# Patient Record
Sex: Male | Born: 1955 | Race: White | Hispanic: No | Marital: Single
Health system: Southern US, Community
[De-identification: ages and names within clinical notes are randomized; demographics above are authoritative.]

---

## 2021-05-23 ENCOUNTER — Other Ambulatory Visit (HOSPITAL_COMMUNITY): Payer: Medicare Other

## 2021-05-23 ENCOUNTER — Inpatient Hospital Stay
Admission: RE | Admit: 2021-05-23 | Discharge: 2021-07-07 | Disposition: A | Discharge status: Skilled Nursing Facility | Payer: Medicare Other | Source: Ambulatory Visit | Attending: Internal Medicine | Admitting: Internal Medicine

## 2021-05-23 DIAGNOSIS — J189 Pneumonia, unspecified organism: Secondary | ICD-10-CM

## 2021-05-23 DIAGNOSIS — K651 Peritoneal abscess: Secondary | ICD-10-CM

## 2021-05-23 DIAGNOSIS — R0902 Hypoxemia: Secondary | ICD-10-CM

## 2021-05-23 DIAGNOSIS — J449 Chronic obstructive pulmonary disease, unspecified: Secondary | ICD-10-CM

## 2021-05-23 DIAGNOSIS — T85598A Other mechanical complication of other gastrointestinal prosthetic devices, implants and grafts, initial encounter: Secondary | ICD-10-CM

## 2021-05-23 DIAGNOSIS — J9690 Respiratory failure, unspecified, unspecified whether with hypoxia or hypercapnia: Secondary | ICD-10-CM

## 2021-05-24 LAB — CBC
HCT: 33.9 % — ABNORMAL LOW (ref 39.0–52.0)
Hemoglobin: 10.2 g/dL — ABNORMAL LOW (ref 13.0–17.0)
MCH: 25.1 pg — ABNORMAL LOW (ref 26.0–34.0)
MCHC: 30.1 g/dL (ref 30.0–36.0)
MCV: 83.5 fL (ref 80.0–100.0)
Platelets: 364 10*3/uL (ref 150–400)
RBC: 4.06 MIL/uL — ABNORMAL LOW (ref 4.22–5.81)
RDW: 21.2 % — ABNORMAL HIGH (ref 11.5–15.5)
WBC: 15.8 10*3/uL — ABNORMAL HIGH (ref 4.0–10.5)
nRBC: 0 % (ref 0.0–0.2)

## 2021-05-24 LAB — BASIC METABOLIC PANEL
Anion gap: 8 (ref 5–15)
BUN: 25 mg/dL — ABNORMAL HIGH (ref 8–23)
CO2: 28 mmol/L (ref 22–32)
Calcium: 8.6 mg/dL — ABNORMAL LOW (ref 8.9–10.3)
Chloride: 98 mmol/L (ref 98–111)
Creatinine, Ser: 0.75 mg/dL (ref 0.61–1.24)
GFR, Estimated: >60 mL/min (ref >60)
Glucose, Bld: 92 mg/dL (ref 70–99)
Potassium: 3.3 mmol/L — ABNORMAL LOW (ref 3.5–5.1)
Sodium: 134 mmol/L — ABNORMAL LOW (ref 135–145)

## 2021-05-24 LAB — MAGNESIUM: Magnesium: 2 mg/dL (ref 1.7–2.4)

## 2021-05-25 LAB — TRIGLYCERIDES: Triglycerides: 59 mg/dL (ref <150)

## 2021-05-25 LAB — POTASSIUM: Potassium: 3.3 mmol/L — ABNORMAL LOW (ref 3.5–5.1)

## 2021-05-25 LAB — PHOSPHORUS: Phosphorus: 3.5 mg/dL (ref 2.5–4.6)

## 2021-05-25 LAB — MAGNESIUM: Magnesium: 2 mg/dL (ref 1.7–2.4)

## 2021-05-26 LAB — POTASSIUM: Potassium: 3.1 mmol/L — ABNORMAL LOW (ref 3.5–5.1)

## 2021-05-28 LAB — COMPREHENSIVE METABOLIC PANEL
ALT: 23 U/L (ref 0–44)
AST: 19 U/L (ref 15–41)
Albumin: 2.2 g/dL — ABNORMAL LOW (ref 3.5–5.0)
Alkaline Phosphatase: 126 U/L (ref 38–126)
Anion gap: 6 (ref 5–15)
BUN: 30 mg/dL — ABNORMAL HIGH (ref 8–23)
CO2: 27 mmol/L (ref 22–32)
Calcium: 8.6 mg/dL — ABNORMAL LOW (ref 8.9–10.3)
Chloride: 105 mmol/L (ref 98–111)
Creatinine, Ser: 0.67 mg/dL (ref 0.61–1.24)
GFR, Estimated: >60 mL/min (ref >60)
Glucose, Bld: 108 mg/dL — ABNORMAL HIGH (ref 70–99)
Potassium: 3.3 mmol/L — ABNORMAL LOW (ref 3.5–5.1)
Sodium: 138 mmol/L (ref 135–145)
Total Bilirubin: 0.4 mg/dL (ref 0.3–1.2)
Total Protein: 6.7 g/dL (ref 6.5–8.1)

## 2021-05-28 LAB — PHOSPHORUS: Phosphorus: 2.9 mg/dL (ref 2.5–4.6)

## 2021-05-28 LAB — TRIGLYCERIDES: Triglycerides: 24 mg/dL (ref <150)

## 2021-05-28 LAB — MAGNESIUM: Magnesium: 1.9 mg/dL (ref 1.7–2.4)

## 2021-05-30 LAB — POTASSIUM: Potassium: 4 mmol/L (ref 3.5–5.1)

## 2021-05-31 ENCOUNTER — Other Ambulatory Visit (HOSPITAL_COMMUNITY): Payer: Medicare Other

## 2021-05-31 LAB — COMPREHENSIVE METABOLIC PANEL
ALT: 20 U/L (ref 0–44)
AST: 15 U/L (ref 15–41)
Albumin: 2.3 g/dL — ABNORMAL LOW (ref 3.5–5.0)
Alkaline Phosphatase: 108 U/L (ref 38–126)
Anion gap: 5 (ref 5–15)
BUN: 34 mg/dL — ABNORMAL HIGH (ref 8–23)
CO2: 24 mmol/L (ref 22–32)
Calcium: 8.2 mg/dL — ABNORMAL LOW (ref 8.9–10.3)
Chloride: 106 mmol/L (ref 98–111)
Creatinine, Ser: 0.58 mg/dL — ABNORMAL LOW (ref 0.61–1.24)
GFR, Estimated: >60 mL/min (ref >60)
Glucose, Bld: 261 mg/dL — ABNORMAL HIGH (ref 70–99)
Potassium: 3.9 mmol/L (ref 3.5–5.1)
Sodium: 135 mmol/L (ref 135–145)
Total Bilirubin: 0.2 mg/dL — ABNORMAL LOW (ref 0.3–1.2)
Total Protein: 6.6 g/dL (ref 6.5–8.1)

## 2021-05-31 LAB — TRIGLYCERIDES: Triglycerides: 48 mg/dL (ref <150)

## 2021-05-31 LAB — PHOSPHORUS: Phosphorus: 3 mg/dL (ref 2.5–4.6)

## 2021-05-31 LAB — MAGNESIUM: Magnesium: 1.9 mg/dL (ref 1.7–2.4)

## 2021-05-31 LAB — POTASSIUM: Potassium: 3.6 mmol/L (ref 3.5–5.1)

## 2021-06-03 LAB — COMPREHENSIVE METABOLIC PANEL
ALT: 26 U/L (ref 0–44)
AST: 19 U/L (ref 15–41)
Albumin: 2.5 g/dL — ABNORMAL LOW (ref 3.5–5.0)
Alkaline Phosphatase: 102 U/L (ref 38–126)
Anion gap: 8 (ref 5–15)
BUN: 31 mg/dL — ABNORMAL HIGH (ref 8–23)
CO2: 30 mmol/L (ref 22–32)
Calcium: 8.2 mg/dL — ABNORMAL LOW (ref 8.9–10.3)
Chloride: 93 mmol/L — ABNORMAL LOW (ref 98–111)
Creatinine, Ser: 0.68 mg/dL (ref 0.61–1.24)
GFR, Estimated: >60 mL/min (ref >60)
Glucose, Bld: 216 mg/dL — ABNORMAL HIGH (ref 70–99)
Potassium: 3.2 mmol/L — ABNORMAL LOW (ref 3.5–5.1)
Sodium: 131 mmol/L — ABNORMAL LOW (ref 135–145)
Total Bilirubin: 0.4 mg/dL (ref 0.3–1.2)
Total Protein: 6.7 g/dL (ref 6.5–8.1)

## 2021-06-03 LAB — MAGNESIUM: Magnesium: 1.9 mg/dL (ref 1.7–2.4)

## 2021-06-03 LAB — TRIGLYCERIDES: Triglycerides: 69 mg/dL (ref <150)

## 2021-06-03 LAB — PHOSPHORUS: Phosphorus: 2.7 mg/dL (ref 2.5–4.6)

## 2021-06-04 LAB — CBC
HCT: 36.9 % — ABNORMAL LOW (ref 39.0–52.0)
Hemoglobin: 11.2 g/dL — ABNORMAL LOW (ref 13.0–17.0)
MCH: 25.8 pg — ABNORMAL LOW (ref 26.0–34.0)
MCHC: 30.4 g/dL (ref 30.0–36.0)
MCV: 85 fL (ref 80.0–100.0)
Platelets: 277 10*3/uL (ref 150–400)
RBC: 4.34 MIL/uL (ref 4.22–5.81)
RDW: 20.5 % — ABNORMAL HIGH (ref 11.5–15.5)
WBC: 18.5 10*3/uL — ABNORMAL HIGH (ref 4.0–10.5)
nRBC: 0 % (ref 0.0–0.2)

## 2021-06-04 LAB — BASIC METABOLIC PANEL
Anion gap: 5 (ref 5–15)
BUN: 29 mg/dL — ABNORMAL HIGH (ref 8–23)
CO2: 32 mmol/L (ref 22–32)
Calcium: 8.1 mg/dL — ABNORMAL LOW (ref 8.9–10.3)
Chloride: 97 mmol/L — ABNORMAL LOW (ref 98–111)
Creatinine, Ser: 0.68 mg/dL (ref 0.61–1.24)
GFR, Estimated: >60 mL/min (ref >60)
Glucose, Bld: 156 mg/dL — ABNORMAL HIGH (ref 70–99)
Potassium: 3.2 mmol/L — ABNORMAL LOW (ref 3.5–5.1)
Sodium: 134 mmol/L — ABNORMAL LOW (ref 135–145)

## 2021-06-05 LAB — CBC
HCT: 38.2 % — ABNORMAL LOW (ref 39.0–52.0)
Hemoglobin: 11.5 g/dL — ABNORMAL LOW (ref 13.0–17.0)
MCH: 25.4 pg — ABNORMAL LOW (ref 26.0–34.0)
MCHC: 30.1 g/dL (ref 30.0–36.0)
MCV: 84.3 fL (ref 80.0–100.0)
Platelets: 266 10*3/uL (ref 150–400)
RBC: 4.53 MIL/uL (ref 4.22–5.81)
RDW: 20.5 % — ABNORMAL HIGH (ref 11.5–15.5)
WBC: 16.8 10*3/uL — ABNORMAL HIGH (ref 4.0–10.5)
nRBC: 0 % (ref 0.0–0.2)

## 2021-06-05 LAB — BASIC METABOLIC PANEL
Anion gap: 6 (ref 5–15)
BUN: 25 mg/dL — ABNORMAL HIGH (ref 8–23)
CO2: 31 mmol/L (ref 22–32)
Calcium: 8.2 mg/dL — ABNORMAL LOW (ref 8.9–10.3)
Chloride: 95 mmol/L — ABNORMAL LOW (ref 98–111)
Creatinine, Ser: 0.64 mg/dL (ref 0.61–1.24)
GFR, Estimated: >60 mL/min (ref >60)
Glucose, Bld: 189 mg/dL — ABNORMAL HIGH (ref 70–99)
Potassium: 3.3 mmol/L — ABNORMAL LOW (ref 3.5–5.1)
Sodium: 132 mmol/L — ABNORMAL LOW (ref 135–145)

## 2021-06-06 LAB — COMPREHENSIVE METABOLIC PANEL
ALT: 29 U/L (ref 0–44)
AST: 17 U/L (ref 15–41)
Albumin: 2.6 g/dL — ABNORMAL LOW (ref 3.5–5.0)
Alkaline Phosphatase: 94 U/L (ref 38–126)
Anion gap: 6 (ref 5–15)
BUN: 29 mg/dL — ABNORMAL HIGH (ref 8–23)
CO2: 30 mmol/L (ref 22–32)
Calcium: 8.4 mg/dL — ABNORMAL LOW (ref 8.9–10.3)
Chloride: 100 mmol/L (ref 98–111)
Creatinine, Ser: 0.64 mg/dL (ref 0.61–1.24)
GFR, Estimated: >60 mL/min (ref >60)
Glucose, Bld: 147 mg/dL — ABNORMAL HIGH (ref 70–99)
Potassium: 3.8 mmol/L (ref 3.5–5.1)
Sodium: 136 mmol/L (ref 135–145)
Total Bilirubin: 0.3 mg/dL (ref 0.3–1.2)
Total Protein: 6.4 g/dL — ABNORMAL LOW (ref 6.5–8.1)

## 2021-06-06 LAB — TRIGLYCERIDES: Triglycerides: 45 mg/dL (ref <150)

## 2021-06-06 LAB — PHOSPHORUS: Phosphorus: 2.5 mg/dL (ref 2.5–4.6)

## 2021-06-06 LAB — MAGNESIUM: Magnesium: 1.9 mg/dL (ref 1.7–2.4)

## 2021-06-06 LAB — HEMOGLOBIN A1C
Hgb A1c MFr Bld: 5.7 % — ABNORMAL HIGH (ref 4.8–5.6)
Mean Plasma Glucose: 117 mg/dL

## 2021-06-08 ENCOUNTER — Other Ambulatory Visit (HOSPITAL_COMMUNITY): Payer: Medicare Other

## 2021-06-08 LAB — CBC
HCT: 36.1 % — ABNORMAL LOW (ref 39.0–52.0)
Hemoglobin: 11.1 g/dL — ABNORMAL LOW (ref 13.0–17.0)
MCH: 26 pg (ref 26.0–34.0)
MCHC: 30.7 g/dL (ref 30.0–36.0)
MCV: 84.5 fL (ref 80.0–100.0)
Platelets: 205 10*3/uL (ref 150–400)
RBC: 4.27 MIL/uL (ref 4.22–5.81)
RDW: 21 % — ABNORMAL HIGH (ref 11.5–15.5)
WBC: 17.9 10*3/uL — ABNORMAL HIGH (ref 4.0–10.5)
nRBC: 0 % (ref 0.0–0.2)

## 2021-06-08 LAB — TSH: TSH: 0.285 u[IU]/mL — ABNORMAL LOW (ref 0.350–4.500)

## 2021-06-08 LAB — URINALYSIS, ROUTINE W REFLEX MICROSCOPIC
Bilirubin Urine: NEGATIVE
Glucose, UA: NEGATIVE mg/dL
Hgb urine dipstick: NEGATIVE
Ketones, ur: NEGATIVE mg/dL
Leukocytes,Ua: NEGATIVE
Nitrite: NEGATIVE
Protein, ur: NEGATIVE mg/dL
Specific Gravity, Urine: 1.013 (ref 1.005–1.030)
pH: 6 (ref 5.0–8.0)

## 2021-06-08 LAB — BASIC METABOLIC PANEL
Anion gap: 7 (ref 5–15)
BUN: 27 mg/dL — ABNORMAL HIGH (ref 8–23)
CO2: 33 mmol/L — ABNORMAL HIGH (ref 22–32)
Calcium: 8.1 mg/dL — ABNORMAL LOW (ref 8.9–10.3)
Chloride: 91 mmol/L — ABNORMAL LOW (ref 98–111)
Creatinine, Ser: 0.64 mg/dL (ref 0.61–1.24)
GFR, Estimated: >60 mL/min (ref >60)
Glucose, Bld: 225 mg/dL — ABNORMAL HIGH (ref 70–99)
Potassium: 4.3 mmol/L (ref 3.5–5.1)
Sodium: 131 mmol/L — ABNORMAL LOW (ref 135–145)

## 2021-06-09 LAB — COMPREHENSIVE METABOLIC PANEL
ALT: 23 U/L (ref 0–44)
AST: 14 U/L — ABNORMAL LOW (ref 15–41)
Albumin: 2.4 g/dL — ABNORMAL LOW (ref 3.5–5.0)
Alkaline Phosphatase: 85 U/L (ref 38–126)
Anion gap: 5 (ref 5–15)
BUN: 25 mg/dL — ABNORMAL HIGH (ref 8–23)
CO2: 34 mmol/L — ABNORMAL HIGH (ref 22–32)
Calcium: 7.9 mg/dL — ABNORMAL LOW (ref 8.9–10.3)
Chloride: 93 mmol/L — ABNORMAL LOW (ref 98–111)
Creatinine, Ser: 0.61 mg/dL (ref 0.61–1.24)
GFR, Estimated: >60 mL/min (ref >60)
Glucose, Bld: 215 mg/dL — ABNORMAL HIGH (ref 70–99)
Potassium: 4.1 mmol/L (ref 3.5–5.1)
Sodium: 132 mmol/L — ABNORMAL LOW (ref 135–145)
Total Bilirubin: 0.5 mg/dL (ref 0.3–1.2)
Total Protein: 5.5 g/dL — ABNORMAL LOW (ref 6.5–8.1)

## 2021-06-09 LAB — TRIGLYCERIDES: Triglycerides: 55 mg/dL (ref <150)

## 2021-06-09 LAB — PHOSPHORUS: Phosphorus: 3.7 mg/dL (ref 2.5–4.6)

## 2021-06-09 LAB — URINE CULTURE: Culture: <10000 — AB

## 2021-06-09 LAB — MAGNESIUM: Magnesium: 2.1 mg/dL (ref 1.7–2.4)

## 2021-06-12 LAB — COMPREHENSIVE METABOLIC PANEL
ALT: 20 U/L (ref 0–44)
AST: 12 U/L — ABNORMAL LOW (ref 15–41)
Albumin: 2.4 g/dL — ABNORMAL LOW (ref 3.5–5.0)
Alkaline Phosphatase: 74 U/L (ref 38–126)
Anion gap: 3 — ABNORMAL LOW (ref 5–15)
BUN: 25 mg/dL — ABNORMAL HIGH (ref 8–23)
CO2: 34 mmol/L — ABNORMAL HIGH (ref 22–32)
Calcium: 7.9 mg/dL — ABNORMAL LOW (ref 8.9–10.3)
Chloride: 93 mmol/L — ABNORMAL LOW (ref 98–111)
Creatinine, Ser: 0.58 mg/dL — ABNORMAL LOW (ref 0.61–1.24)
GFR, Estimated: >60 mL/min (ref >60)
Glucose, Bld: 142 mg/dL — ABNORMAL HIGH (ref 70–99)
Potassium: 4.1 mmol/L (ref 3.5–5.1)
Sodium: 130 mmol/L — ABNORMAL LOW (ref 135–145)
Total Bilirubin: 0.4 mg/dL (ref 0.3–1.2)
Total Protein: 5.3 g/dL — ABNORMAL LOW (ref 6.5–8.1)

## 2021-06-12 LAB — MAGNESIUM: Magnesium: 2.2 mg/dL (ref 1.7–2.4)

## 2021-06-12 LAB — TRIGLYCERIDES: Triglycerides: 59 mg/dL (ref <150)

## 2021-06-12 LAB — PHOSPHORUS: Phosphorus: 3.6 mg/dL (ref 2.5–4.6)

## 2021-06-13 LAB — BASIC METABOLIC PANEL
Anion gap: 3 — ABNORMAL LOW (ref 5–15)
BUN: 24 mg/dL — ABNORMAL HIGH (ref 8–23)
CO2: 36 mmol/L — ABNORMAL HIGH (ref 22–32)
Calcium: 8 mg/dL — ABNORMAL LOW (ref 8.9–10.3)
Chloride: 97 mmol/L — ABNORMAL LOW (ref 98–111)
Creatinine, Ser: 0.69 mg/dL (ref 0.61–1.24)
GFR, Estimated: >60 mL/min (ref >60)
Glucose, Bld: 185 mg/dL — ABNORMAL HIGH (ref 70–99)
Potassium: 4.1 mmol/L (ref 3.5–5.1)
Sodium: 136 mmol/L (ref 135–145)

## 2021-06-14 LAB — BASIC METABOLIC PANEL
Anion gap: 7 (ref 5–15)
BUN: 25 mg/dL — ABNORMAL HIGH (ref 8–23)
CO2: 28 mmol/L (ref 22–32)
Calcium: 8 mg/dL — ABNORMAL LOW (ref 8.9–10.3)
Chloride: 99 mmol/L (ref 98–111)
Creatinine, Ser: 0.59 mg/dL — ABNORMAL LOW (ref 0.61–1.24)
GFR, Estimated: >60 mL/min (ref >60)
Glucose, Bld: 120 mg/dL — ABNORMAL HIGH (ref 70–99)
Potassium: 3.2 mmol/L — ABNORMAL LOW (ref 3.5–5.1)
Sodium: 134 mmol/L — ABNORMAL LOW (ref 135–145)

## 2021-06-15 LAB — COMPREHENSIVE METABOLIC PANEL
ALT: 21 U/L (ref 0–44)
AST: 15 U/L (ref 15–41)
Albumin: 2.5 g/dL — ABNORMAL LOW (ref 3.5–5.0)
Alkaline Phosphatase: 73 U/L (ref 38–126)
Anion gap: 6 (ref 5–15)
BUN: 22 mg/dL (ref 8–23)
CO2: 30 mmol/L (ref 22–32)
Calcium: 8 mg/dL — ABNORMAL LOW (ref 8.9–10.3)
Chloride: 98 mmol/L (ref 98–111)
Creatinine, Ser: 0.59 mg/dL — ABNORMAL LOW (ref 0.61–1.24)
GFR, Estimated: >60 mL/min (ref >60)
Glucose, Bld: 146 mg/dL — ABNORMAL HIGH (ref 70–99)
Potassium: 3.4 mmol/L — ABNORMAL LOW (ref 3.5–5.1)
Sodium: 134 mmol/L — ABNORMAL LOW (ref 135–145)
Total Bilirubin: 0.6 mg/dL (ref 0.3–1.2)
Total Protein: 5.2 g/dL — ABNORMAL LOW (ref 6.5–8.1)

## 2021-06-15 LAB — TRIGLYCERIDES: Triglycerides: 84 mg/dL (ref <150)

## 2021-06-15 LAB — MAGNESIUM: Magnesium: 1.8 mg/dL (ref 1.7–2.4)

## 2021-06-15 LAB — PHOSPHORUS: Phosphorus: 2.5 mg/dL (ref 2.5–4.6)

## 2021-06-16 LAB — POTASSIUM: Potassium: 3.6 mmol/L (ref 3.5–5.1)

## 2021-06-17 LAB — BASIC METABOLIC PANEL
Anion gap: 5 (ref 5–15)
BUN: 22 mg/dL (ref 8–23)
CO2: 35 mmol/L — ABNORMAL HIGH (ref 22–32)
Calcium: 8 mg/dL — ABNORMAL LOW (ref 8.9–10.3)
Chloride: 94 mmol/L — ABNORMAL LOW (ref 98–111)
Creatinine, Ser: 0.64 mg/dL (ref 0.61–1.24)
GFR, Estimated: >60 mL/min (ref >60)
Glucose, Bld: 124 mg/dL — ABNORMAL HIGH (ref 70–99)
Potassium: 3.6 mmol/L (ref 3.5–5.1)
Sodium: 134 mmol/L — ABNORMAL LOW (ref 135–145)

## 2021-06-18 ENCOUNTER — Other Ambulatory Visit (HOSPITAL_COMMUNITY): Payer: Medicare Other

## 2021-06-18 LAB — BASIC METABOLIC PANEL
Anion gap: 6 (ref 5–15)
BUN: 21 mg/dL (ref 8–23)
CO2: 32 mmol/L (ref 22–32)
Calcium: 7.9 mg/dL — ABNORMAL LOW (ref 8.9–10.3)
Chloride: 97 mmol/L — ABNORMAL LOW (ref 98–111)
Creatinine, Ser: 0.57 mg/dL — ABNORMAL LOW (ref 0.61–1.24)
GFR, Estimated: >60 mL/min (ref >60)
Glucose, Bld: 149 mg/dL — ABNORMAL HIGH (ref 70–99)
Potassium: 3.5 mmol/L (ref 3.5–5.1)
Sodium: 135 mmol/L (ref 135–145)

## 2021-06-18 LAB — CBC
HCT: 36.1 % — ABNORMAL LOW (ref 39.0–52.0)
Hemoglobin: 11.5 g/dL — ABNORMAL LOW (ref 13.0–17.0)
MCH: 26.9 pg (ref 26.0–34.0)
MCHC: 31.9 g/dL (ref 30.0–36.0)
MCV: 84.3 fL (ref 80.0–100.0)
Platelets: 128 10*3/uL — ABNORMAL LOW (ref 150–400)
RBC: 4.28 MIL/uL (ref 4.22–5.81)
RDW: 21.3 % — ABNORMAL HIGH (ref 11.5–15.5)
WBC: 17.2 10*3/uL — ABNORMAL HIGH (ref 4.0–10.5)
nRBC: 0 % (ref 0.0–0.2)

## 2021-06-18 LAB — COMPREHENSIVE METABOLIC PANEL
ALT: 25 U/L (ref 0–44)
AST: 18 U/L (ref 15–41)
Albumin: 2.6 g/dL — ABNORMAL LOW (ref 3.5–5.0)
Alkaline Phosphatase: 88 U/L (ref 38–126)
Anion gap: 3 — ABNORMAL LOW (ref 5–15)
BUN: 21 mg/dL (ref 8–23)
CO2: 37 mmol/L — ABNORMAL HIGH (ref 22–32)
Calcium: 8.3 mg/dL — ABNORMAL LOW (ref 8.9–10.3)
Chloride: 94 mmol/L — ABNORMAL LOW (ref 98–111)
Creatinine, Ser: 0.68 mg/dL (ref 0.61–1.24)
GFR, Estimated: >60 mL/min (ref >60)
Glucose, Bld: 124 mg/dL — ABNORMAL HIGH (ref 70–99)
Potassium: 4.1 mmol/L (ref 3.5–5.1)
Sodium: 134 mmol/L — ABNORMAL LOW (ref 135–145)
Total Bilirubin: 0.6 mg/dL (ref 0.3–1.2)
Total Protein: 5.2 g/dL — ABNORMAL LOW (ref 6.5–8.1)

## 2021-06-18 LAB — PHOSPHORUS: Phosphorus: 3.2 mg/dL (ref 2.5–4.6)

## 2021-06-18 LAB — MAGNESIUM: Magnesium: 2 mg/dL (ref 1.7–2.4)

## 2021-06-18 LAB — TRIGLYCERIDES: Triglycerides: 87 mg/dL (ref <150)

## 2021-06-18 MED ORDER — IOHEXOL 300 MG/ML  SOLN
100.0000 mL | Freq: Once | INTRAMUSCULAR | Status: AC | PRN
  Administered 2021-06-18: 100 mL via INTRAVENOUS

## 2021-06-19 NOTE — Consult Note (Signed)
Chief Complaint: Intra abdominal abscess. Request is for intra abdominal abscess drain placement.   Referring Physician(s): Dr. Theora Gianotti  Supervising Physician: Simonne Come  Patient Status: [facility] - [facility]  History of Present Illness: Omar Hernandez is a 65 y.o. male [facility] patient. History of a flutter, HTN, chronic pain, esophageal stricture, perforated gallbladder. 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 Patient found to have abdominal pain and fever. CT and pelvis from 7.12.22 reads  Residual fluid collection suspicious for abscess in the RIGHT lower quadrant appears separate from the gallbladder but there was a tube either in the gallbladder or in this fluid collection on previous plain film and cholecystostomy tube was referenced at [facility] based on report from 05/16/2021. Tract extends along the body wall likely from previous catheter.  No past medical history on file.  Allergies: Patient has no allergy information on record.  Medications: Prior to Admission medications   Not on File     No family history on file.  Social History   Socioeconomic History   Marital status: Single    Spouse name: Not on file   Number of children: Not on file   Years of education: Not on file   Highest education level: Not on file  Occupational History   Not on file  Tobacco Use   Smoking status: Not on file   Smokeless tobacco: Not on file  Substance and Sexual Activity   Alcohol use: Not on file   Drug use: Not on file   Sexual activity: Not on file  Other Topics Concern   Not on file  Social History Narrative   Not on file   Social Determinants of Health   Financial Resource Strain: Not on file  Food Insecurity: Not on file  Transportation Needs: Not on file  Physical Activity: Not on file   Stress: Not on file  Social Connections: Not on file     Review of Systems: A 12 point ROS discussed and pertinent positives are indicated in the HPI above.  All other systems are negative.  Review of Systems  Constitutional:  Negative for fever.  HENT:  Negative for congestion.   Respiratory:  Negative for cough and shortness of breath.   Cardiovascular:  Negative for chest pain.  Gastrointestinal:  Positive for abdominal pain (RUQ near colostomy bag).  Neurological:  Negative for headaches.  Psychiatric/Behavioral:  Negative for behavioral problems and confusion.    Vital Signs: There were no vitals taken for this visit.  Physical Exam Vitals and nursing note reviewed.  Constitutional:      Appearance: He is well-developed.  HENT:     Head: Normocephalic.  Cardiovascular:     Rate and Rhythm: Normal rate and regular rhythm.     Heart sounds: Normal heart sounds.  Pulmonary:     Breath sounds: Normal breath sounds.     Comments: On o2 via [place]. Prior trach site noted.  Abdominal:     Comments: RUQ ostomy bag noted and clean dry dressing midline from most recent abdominal surgeries,   Musculoskeletal:        General: Normal range of motion.     Cervical back: Normal range of motion.  Skin:    General: Skin is dry.  Neurological:     Mental Status: He is alert and oriented to person, place, and time.    Imaging:  CT ABDOMEN PELVIS W CONTRAST  Result Date: 06/18/2021 CLINICAL DATA:  Abdominal pain and fever, rule out fistula and short gut syndrome in a 65 year old male with increased drainage EXAM: CT ABDOMEN AND PELVIS WITH CONTRAST TECHNIQUE: Multidetector CT imaging of the abdomen and pelvis was performed using the standard protocol following bolus administration of intravenous contrast. CONTRAST:  100mL OMNIPAQUE IOHEXOL 300 MG/ML  SOLN COMPARISON:  [facility] FINDINGS: Lower chest: Basilar atelectasis. No effusion. No consolidative changes.  Aortic atherosclerosis. Hepatobiliary: Smooth contour of the liver. Gallbladder remains in place with a tract that extends to the RIGHT hemiabdomen adjacent to the gallbladder. There is also a complex appearing fluid collection with peripheral enhancement also adjacent to the gallbladder in the RIGHT retroperitoneum which is contiguous with the psoas in the iliacus muscle in the RIGHT hemipelvis where it measures 3.8 x 3.3 cm. No discrete communication with the gallbladder is identified. There is no biliary duct dilation. Pancreas: Normal contour without sign of inflammation or visible lesion. Spleen: Normal size and contour. Adrenals/Urinary Tract: Adrenal glands are normal. Symmetric renal enhancement with signs of cortical scarring on the RIGHT greater than LEFT. No hydronephrosis. Urinary bladder with smooth contours. Stomach/Bowel: Stomach is under distended without signs of adjacent stranding. Small bowel distension in the central abdomen is grossly similar to the prior study from May 23, 2021. Post colectomy with signs of small bowel to rectal anastomosis. No extraluminal contrast material. Jejunum is under distended. Contrast material is passed into the distal jejunum and into the ileum. No sign of adjacent stranding. Vascular/Lymphatic: Atheromatous plaque of the abdominal aorta and its branches without signs of aortic dilation. Smooth contour of the IVC. There is no gastrohepatic or hepatoduodenal ligament lymphadenopathy. No retroperitoneal or mesenteric lymphadenopathy. No pelvic sidewall lymphadenopathy. Reproductive: Prostate with calcifications, otherwise unremarkable. Other: Rectus diastasis is in thinning of abdominal wall musculature related to prior abdominal surgery. Fluid collection in the RIGHT lower quadrant in the iliac fossa a tracking towards the gallbladder. No body wall collection. No free intra-abdominal fluid or air. Musculoskeletal: Compression fractures of the spine. Post RIGHT hip  arthroplasty. Osteopenia. Loss of height of vertebral bodies throughout the visible thoracic and lumbar spine with slightly greater than 50% loss of height and some retropulsion at the L1 level, slightly less loss of height at L2 also with bony retropulsion and L3 as well as L5 with mild loss of height at L4. Findings are described on a report from 05/16/2021. IMPRESSION: 1. Residual fluid collection suspicious for abscess in the RIGHT lower quadrant appears separate from the gallbladder but there was a tube either in the gallbladder or in this fluid collection on previous plain film and [facility]cyst[facility] report from 05/16/2021. Tract extends along the body wall likely from previous catheter. 2. Post colectomy with signs of small bowel to rectal anastomosis. No sign of extraluminal contrast material. Dilation of distal small bowel loops favored to represent a chronic process. There is no sign of overt obstruction and dilation is similar to the previous abdominal plain film from June of 2022. 3. Osteopenia with compression fractures of the visible thoracic and lumbar spine. Findings are described on a report from 05/16/2021. Correlate with any new symptoms. L1 and L2 show mild bony retropulsion and greatest loss of height, correlate with any new signs of tenderness. 4. Aortic atherosclerosis. Aortic Atherosclerosis (ICD10-I70.0). Electronically Signed   By: Donzetta KohutGeoffrey  Wile M.D.   On: 06/18/2021 19:25  DG CHEST PORT 1 VIEW  Result Date: 06/08/2021 CLINICAL DATA:  Respiratory failure EXAM: PORTABLE CHEST 1 VIEW COMPARISON:  05/31/2021 FINDINGS: A LEFT PICC line is again noted with tip overlying the LOWER SVC. Cardiomediastinal silhouette is unchanged. Subsegmental atelectasis/scarring overlying both mid lungs again identified. No airspace disease, consolidation, pleural effusion or pneumothorax noted. No acute bony abnormalities are present. Surgical clips in the RIGHT  axilla are present. IMPRESSION: No evidence of acute cardiopulmonary disease. Electronically Signed   By: Harmon Pier M.D.   On: 06/08/2021 13:15   DG CHEST PORT 1 VIEW  Result Date: 05/31/2021 CLINICAL DATA:  Hypoxia, pneumonia, COPD EXAM: PORTABLE CHEST 1 VIEW COMPARISON:  None. FINDINGS: Left upper extremity PICC tip terminates at the superior cavoatrial junction. Extensive atherosclerotic calcifications seen throughout the tortuous aorta. Remaining cardiomediastinal contours are unremarkable. Bandlike scarring seen in the right mid lung. Additional linear scarring and or atelectasis extending from the left hilum. Diffusely coarsened reticular opacities in the lungs. No focal airspace disease. No pneumothorax or effusion. No acute osseous or soft tissue abnormality. IMPRESSION: Diffusely coarsened interstitial changes in the lungs with bandlike areas of reticular scarring and or subsegmental atelectasis. Findings compatible with history of COPD. No acute consolidative process is clearly evident. Electronically Signed   By: Kreg Shropshire M.D.   On: 05/31/2021 15:17   DG Abd Portable 1V  Result Date: 05/23/2021 CLINICAL DATA:  Feeding tube EXAM: PORTABLE ABDOMEN - 1 VIEW COMPARISON:  None. FINDINGS: Esophageal tube tip overlies the distal stomach. Mild air distended bowel in the central and low abdomen without obstructive change. Drainage catheter over the right lower quadrant. Right hip replacement. IMPRESSION: Esophageal tube tip overlies the distal stomach Electronically Signed   By: Jasmine Pang M.D.   On: 05/23/2021 23:46    Labs:  CBC: Recent Labs    06/04/21 0435 06/05/21 0542 06/08/21 0417 06/18/21 0425  WBC 18.5* 16.8* 17.9* 17.2*  HGB 11.2* 11.5* 11.1* 11.5*  HCT 36.9* 38.2* 36.1* 36.1*  PLT 277 266 205 128*    COAGS: No results for input(s): INR, APTT in the last 8760 hours.  BMP: Recent Labs    06/15/21 0954 06/16/21 0851 06/17/21 1045 06/18/21 0425 06/18/21 1058  NA  134*  --  134* 135 134*  K 3.4* 3.6 3.6 3.5 4.1  CL 98  --  94* 97* 94*  CO2 30  --  35* 32 37*  GLUCOSE 146*  --  124* 149* 124*  BUN 22  --  22 21 21   CALCIUM 8.0*  --  8.0* 7.9* 8.3*  CREATININE 0.59*  --  0.64 0.57* 0.68  GFRNONAA >60  --  >60 >60 >60    LIVER FUNCTION TESTS: Recent Labs    06/09/21 0542 06/12/21 0956 06/15/21 0954 06/18/21 1058  BILITOT 0.5 0.4 0.6 0.6  AST 14* 12* 15 18  ALT 23 20 21 25   ALKPHOS 85 74 73 88  PROT 5.5* 5.3* 5.2* 5.2*  ALBUMIN 2.4* 2.4* 2.5* 2.6*      Assessment and Plan:  65 y.o. male [facility]. 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 Patient reporting abdominal pain and found to be febrile. CT and pelvis from 7.12.22 reads  Residual fluid collection suspicious for abscess in the RIGHT lower quadrant appears separate from the gallbladder  but there was a tube either in the gallbladder or in this fluid collection on previous plain film and cholecystostomy tube was referenced at [facility] based on report from 05/16/2021. Tract extends along the body wall likely from previous catheter. Team is requesting an intraabdominal abscess drain placement.  All abs and medications are within acceptable parameters. Patient has been refusing Lovenox since admission. NKDA. Labs from  7.12.22 shows WBC 17.2. No cultures pending. RN at bedside states the patient has been afebrile today.   IR consulted for possible intra abdominal abscess drain placement. Case has been reviewed and procedure approved by Dr. Grace Isaac.  Patient tentatively scheduled for 7.13.22.  Team directly instructed to: Keep Patient to be NPO after midnight Hold prophylactic anticoagulation 24 hours prior to scheduled  procedure. IR will call patient when ready.  Risks and benefits discussed with the patient including bleeding, infection, damage to adjacent structures, bowel perforation/fistula connection, and sepsis.  All of the patient's questions were answered, patient is agreeable to proceed. Consent signed and in IR control room   Thank you for this interesting consult.  I greatly enjoyed meeting Drayk Humbarger and look forward to participating in their care.  A copy of this report was sent to the requesting provider on this date.  Electronically Signed: Alene Mires, NP 06/19/2021, 3:43 PM   I spent a total of 40 Minutes    in face to face in clinical consultation, greater than 50% of which was counseling/coordinating care for intra abdominal abscess drain placement

## 2021-06-20 ENCOUNTER — Other Ambulatory Visit (HOSPITAL_COMMUNITY): Payer: Medicare Other

## 2021-06-20 LAB — BASIC METABOLIC PANEL
Anion gap: 5 (ref 5–15)
BUN: 21 mg/dL (ref 8–23)
CO2: 32 mmol/L (ref 22–32)
Calcium: 7.7 mg/dL — ABNORMAL LOW (ref 8.9–10.3)
Chloride: 99 mmol/L (ref 98–111)
Creatinine, Ser: 0.66 mg/dL (ref 0.61–1.24)
GFR, Estimated: >60 mL/min (ref >60)
Glucose, Bld: 138 mg/dL — ABNORMAL HIGH (ref 70–99)
Potassium: 3.6 mmol/L (ref 3.5–5.1)
Sodium: 136 mmol/L (ref 135–145)

## 2021-06-20 MED ORDER — MIDAZOLAM HCL 2 MG/2ML IJ SOLN
INTRAMUSCULAR | Status: AC | PRN
  Administered 2021-06-20: 1 mg via INTRAVENOUS
  Administered 2021-06-20: 0.5 mg via INTRAVENOUS

## 2021-06-20 MED ORDER — FENTANYL CITRATE (PF) 100 MCG/2ML IJ SOLN
INTRAMUSCULAR | Status: AC | PRN
  Administered 2021-06-20 (×2): 25 ug via INTRAVENOUS

## 2021-06-20 MED ORDER — FENTANYL CITRATE (PF) 100 MCG/2ML IJ SOLN
INTRAMUSCULAR | Status: AC
  Filled 2021-06-20: qty 2

## 2021-06-20 MED ORDER — MIDAZOLAM HCL 2 MG/2ML IJ SOLN
INTRAMUSCULAR | Status: AC
  Filled 2021-06-20: qty 2

## 2021-06-20 NOTE — Procedures (Signed)
Pre procedural Dx: Right lower quadrant abscess Post procedural Dx: Same  Technically successful CT guided placed of a 12 Fr drainage catheter placement into the right lower quadrant abscess yielding 30 cc of purulent fluid.    A representative aspirated sample was capped and sent to the laboratory for analysis.    EBL: Trace Complications: None immediate  Katherina Right, MD Pager #: [phone]

## 2021-06-20 NOTE — Consult Note (Signed)
Infectious Disease Consultation   Omar Hernandez  ZOX:[id number]  DOB: May 29, 1956  DOA: 05/23/2021  Requesting physician: Dr. Sharyon MedicusHijazi  Reason for consultation: Antibiotic Recommendations   History of Present Illness: Omar Hernandez is an 65 y.o. 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  He had pain around percutaneous cholecystostomy.  Patient at baseline is on oxygen 4 L.  In the emergency room he was noted to have significant leukocytosis.  He was also anemic with hemoglobin of 6.8 and was given 1 unit PRBCs.  Chest x-ray showed a patchy opacities in the right mid and lower lung.  Head CT was negative for any intracranial abnormality.  CT imaging showed multifocal pneumonia without evidence of acute cholecystitis.  He received treatment with IV vancomycin, cefepime.  He was also treated with steroids.  His sputum cultures showed MRSA.  He was also diuresed with Lasix.  He had a history of multiple abdominal surgeries as mentioned above percutaneous cholecystostomy due to ruptured gallbladder, concern for short gut syndrome.  GI was consulted.  For short gut syndrome and possible PEG tube placement.  Per GI at the acute facility unsure if PEG tube placement would offer much benefit.  Patient was placed on TPN for nutrition.  Due to his complex medical problems he was transferred and [facility].  CT of the abdomen pelvis done on 06/18/2021 per report resident fluid collection suspicious for abscess in the right lower quadrant separate from the gallbladder with tracts extending along the body wall likely from previous catheter.  At this time  patient is complaining of nausea, abdominal pain.  He also has some cough.  Review of Systems:  Review of systems negative except as mentioned above in the HPI  Past M Arthritis   Asthma   Atrial flutter (HCC)   BPH (benign prostatic hyperplasia)   COPD (chronic obstructive pulmonary disease) (HCC)   Hypertension   Hypertension with goal to be determined   Small bowel perforationedical History:   Past Surgical History:  EXPLORATORY LAPAROTOMY   SMALL INTESTINE SURGERY   TRACHEOSTOMY   Allergies: No known drug allergies   Social History: Former smoker, occasional alcohol use, no other recreational drug abuse   Family History:  Coronary artery disease Brother  S/p CABG   Hypertension Brother  Physical Exam: Vitals:   06/20/21 0945 06/20/21 0950 06/20/21 0955 06/20/21 1010  BP: 118/76 122/71 112/67 124/74  Pulse: 77 69 79 66  Resp: 11 14 13 13   SpO2: 100% 100% 100% 96%    Constitutional: Patient awake and oriented, not in any acute distress at this time Head: Atraumatic, normocephalic Eyes: PERLA, EOMI, irises appear normal, anicteric sclera,  ENMT: external ears and nose appear normal, normal hearing, moist oral mucosa  Neck: Supple CVS: S1-S2  Respiratory: Occasional rhonchi, no wheezing Abdomen: Soft, nonspecific tenderness without any guarding or rebound, enterocutaneous fistula, abdominal  dressing in place Musculoskeletal: No edema Neuro: Debility with generalized weakness otherwise grossly nonfocal Psych: Stable Skin: no rashes  Data reviewed:  I have personally reviewed following labs and imaging studies Labs:  CBC: Recent Labs  Lab 06/18/21 0425  WBC 17.2*  HGB 11.5*  HCT 36.1*  MCV  84.3  PLT 128*    Basic Metabolic Panel: Recent Labs  Lab 06/15/21 0210 06/15/21 0954 06/16/21 0851 06/17/21 1045 06/18/21 0425 06/18/21 1058 06/20/21 0634  NA  --  134*  --  134* 135 134* 136  K  --  3.4*   < > 3.6 3.5 4.1 3.6  CL  --  98  --  94* 97*  94* 99  CO2  --  30  --  35* 32 37* 32  GLUCOSE  --  146*  --  124* 149* 124* 138*  BUN  --  22  --  22 21 21 21   CREATININE  --  0.59*  --  0.64 0.57* 0.68 0.66  CALCIUM  --  8.0*  --  8.0* 7.9* 8.3* 7.7*  MG 1.8  --   --   --  2.0  --   --   PHOS 2.5  --   --   --  3.2  --   --    < > = values in this interval not displayed.   GFR CrCl cannot be calculated (Unknown ideal weight.). Liver Function Tests: Recent Labs  Lab 06/15/21 0954 06/18/21 1058  AST 15 18  ALT 21 25  ALKPHOS 73 88  BILITOT 0.6 0.6  PROT 5.2* 5.2*  ALBUMIN 2.5* 2.6*   No results for input(s): LIPASE, AMYLASE in the last 168 hours. No results for input(s): AMMONIA in the last 168 hours. Coagulation profile No results for input(s): INR, PROTIME in the last 168 hours.  Cardiac Enzymes: No results for input(s): CKTOTAL, CKMB, CKMBINDEX, TROPONINI in the last 168 hours. BNP: Invalid input(s): POCBNP CBG: No results for input(s): GLUCAP in the last 168 hours. D-Dimer No results for input(s): DDIMER in the last 72 hours. Hgb A1c No results for input(s): HGBA1C in the last 72 hours. Lipid Profile Recent Labs    06/18/21 0425  TRIG 87   Thyroid function studies No results for input(s): TSH, T4TOTAL, T3FREE, THYROIDAB in the last 72 hours.  Invalid input(s): FREET3 Anemia work up No results for input(s): VITAMINB12, FOLATE, FERRITIN, TIBC, IRON, RETICCTPCT in the last 72 hours. Urinalysis    Component Value Date/Time   COLORURINE YELLOW 06/08/2021 1447   APPEARANCEUR CLEAR 06/08/2021 1447   LABSPEC 1.013 06/08/2021 1447   PHURINE 6.0 06/08/2021 1447   GLUCOSEU NEGATIVE 06/08/2021 1447   HGBUR NEGATIVE 06/08/2021 1447   BILIRUBINUR NEGATIVE 06/08/2021 1447   KETONESUR NEGATIVE 06/08/2021 1447   PROTEINUR NEGATIVE 06/08/2021 1447   NITRITE NEGATIVE 06/08/2021 1447   LEUKOCYTESUR NEGATIVE 06/08/2021 1447     Sepsis Labs Invalid input(s): PROCALCITONIN,  WBC,   LACTICIDVEN Microbiology Recent Results (from the past 240 hour(s))  Aerobic/Anaerobic Culture w Gram Stain (surgical/deep wound)     Status: None (Preliminary result)   Collection Time: 06/20/21 11:26 AM   Specimen: Abscess  Result Value Ref Range Status   Specimen Description ABSCESS  Final   Special Requests DRAIN  Final   Gram Stain   Final    MODERATE WBC PRESENT,BOTH PMN AND MONONUCLEAR FEW GRAM VARIABLE ROD Performed at [facility], [address] [place], [place]    Culture PENDING  Incomplete   Report Status PENDING  Incomplete    Inpatient Medications:   Please see MAR  Radiological Exams on Admission: CT IMAGE GUIDED DRAINAGE BY PERCUTANEOUS CATHETER  Result Date: 06/20/2021 INDICATION: Complicated postoperative history with previous interventions performed at an outside institution, now with indeterminate  fluid collection with the right lower abdominal quadrant worrisome for abscess. Please perform CT-guided aspiration and/or drainage catheter placement for infection source control purposes. EXAM: CT IMAGE GUIDED DRAINAGE BY PERCUTANEOUS CATHETER COMPARISON:  CT abdomen and pelvis-06/18/2021 MEDICATIONS: The patient is currently admitted to the hospital and receiving intravenous antibiotics. The antibiotics were administered within an appropriate time frame prior to the initiation of the procedure. ANESTHESIA/SEDATION: Moderate (conscious) sedation was employed during this procedure. A total of Versed 1.5 mg and Fentanyl 50 mcg was administered intravenously. Moderate Sedation Time: 24 minutes. The patient's level of consciousness and vital signs were monitored continuously by radiology nursing throughout the procedure under my direct supervision. CONTRAST:  None COMPLICATIONS: None immediate. PROCEDURE: Informed written consent was obtained from the patient after a discussion of the risks, benefits and alternatives to treatment. The patient was placed supine on  the CT gantry and a pre procedural CT was performed re-demonstrating the known abscess/fluid collection within the right lower abdominal quadrant measuring approximately 4.9 x 3.9 cm (image 23, series 2). The procedure was planned. A timeout was performed prior to the initiation of the procedure. The skin overlying the inferolateral aspect of the right lower abdomen/pelvis was prepped and draped in the usual sterile fashion. The overlying soft tissues were anesthetized with 1% lidocaine with epinephrine. Appropriate trajectory was planned with the use of a 22 gauge spinal needle. An 18 gauge trocar needle was advanced into the abscess/fluid collection and a short Amplatz super stiff wire was coiled within the collection. Appropriate positioning was confirmed with a limited CT scan. The tract was serially dilated allowing placement of a 12 [place] all-purpose drainage catheter. Appropriate positioning was confirmed with a limited postprocedural CT scan. Approximately 30 ml of purulent fluid was aspirated. The tube was connected to a drainage bag and sutured in place. A dressing was placed. The patient tolerated the procedure well without immediate post procedural complication. IMPRESSION: Successful CT guided placement of a 10 French all purpose drain catheter into the right lower abdominal/pelvic abscess with aspiration of 30 mL of purulent fluid. Samples were sent to the laboratory as requested by the ordering clinical team. Electronically Signed   By: Simonne Come M.D.   On: 06/20/2021 11:03    Impression/Recommendations Active Problems: Acute on chronic respiratory failure Multifocal pneumonia with MRSA Intra-abdominal abscess Leukocytosis Enterocutaneous fistula COPD History of short gut syndrome and status post multiple abdominal surgeries Percutaneous cholecystostomy secondary to ruptured gallbladder Atrial fibrillation BPH  Acute on chronic respiratory failure: Likely secondary to multifocal  pneumonia.  He received treatment with antibiotics at the acute facility.  He received treatment with IV vancomycin.  Subsequently received treatment with cefepime and Flagyl as well.  Remains on oxygen by nasal cannula.  If his respite status worsens suggest to repeat chest imaging preferably chest CT which can be done without contrast if concern for renal compromise.  Intra-abdominal abscess: He had a CT imaging done which showed findings concerning for intra-abdominal abscess.  Currently on treatment with Flagyl.  Flagyl covers for anaerobes but does not cover for other gram-negative organisms.  Therefore at this time suggest to discontinue the Flagyl and switch to IV Zosyn.  We will plan to treat for tentative duration of 2 weeks pending improvement.  IR consulted for abscess drainage.  Suggest to send for abscess cultures.  Follow-up on the cultures and adjust antibiotics accordingly.  Suggest to repeat imaging in 2 weeks to document the resolution of the intra-abdominal abscess.  Leukocytosis: Likely  secondary to intra-abdominal abscess.  However, he is also on TPN.  Suggest to send for blood cultures because he is high risk for fungemia given that he is on TPN.  Please continue to have worsening leukocytosis or fevers greater than 101 recommend adding empiric fluconazole.  Follow-up on the cultures and adjust antibiotics accordingly.  Enterocutaneous fistula: Continue management per the primary team.  COPD: Continue medication and management per primary team.  History of short gut syndrome status post multiple abdominal surgeries: Currently has a dressing in place.  Antibiotics as mentioned above.  Continue management per the primary team.  Percutaneous cholecystostomy secondary to ruptured gallbladder: He previously had a drain.  This has been removed.  Now unfortunately intra-abdominal abscess.  IR consulted for drainage of the abscess.  Recommend to send for abscess cultures.  On antibiotics as  mentioned above.  Follow-up on the culture results and adjust antibiotics accordingly.  Atrial fibrillation pulmonary medications and management per the primary team.  BPH: He is on Flomax.  Due to BPH he is high risk for urinary retention and UTI.  If he starts having worsening fevers or worsening leukocytosis suggest to send for pancultures.  Due to his complex medical problems he is high risk for worsening and decompensation.  Plan of care discussed with the primary team and pharmacy.  Thank you for this consultation.    Vonzella Nipple M.D. 06/20/2021, 6:33 PM

## 2021-06-21 ENCOUNTER — Other Ambulatory Visit (HOSPITAL_COMMUNITY): Payer: Medicare Other

## 2021-06-21 LAB — COMPREHENSIVE METABOLIC PANEL WITH GFR
ALT: 26 U/L (ref 0–44)
AST: 15 U/L (ref 15–41)
Albumin: 2.5 g/dL — ABNORMAL LOW (ref 3.5–5.0)
Alkaline Phosphatase: 74 U/L (ref 38–126)
Anion gap: 4 — ABNORMAL LOW (ref 5–15)
BUN: 17 mg/dL (ref 8–23)
CO2: 34 mmol/L — ABNORMAL HIGH (ref 22–32)
Calcium: 8 mg/dL — ABNORMAL LOW (ref 8.9–10.3)
Chloride: 95 mmol/L — ABNORMAL LOW (ref 98–111)
Creatinine, Ser: 0.67 mg/dL (ref 0.61–1.24)
GFR, Estimated: >60 mL/min
Glucose, Bld: 132 mg/dL — ABNORMAL HIGH (ref 70–99)
Potassium: 3 mmol/L — ABNORMAL LOW (ref 3.5–5.1)
Sodium: 133 mmol/L — ABNORMAL LOW (ref 135–145)
Total Bilirubin: 0.4 mg/dL (ref 0.3–1.2)
Total Protein: 4.9 g/dL — ABNORMAL LOW (ref 6.5–8.1)

## 2021-06-21 LAB — TRIGLYCERIDES: Triglycerides: 76 mg/dL (ref <150)

## 2021-06-21 LAB — MAGNESIUM
Magnesium: 1.7 mg/dL (ref 1.7–2.4)
Magnesium: 2.3 mg/dL (ref 1.7–2.4)

## 2021-06-21 LAB — PHOSPHORUS: Phosphorus: 2.9 mg/dL (ref 2.5–4.6)

## 2021-06-22 LAB — POTASSIUM: Potassium: 3.1 mmol/L — ABNORMAL LOW (ref 3.5–5.1)

## 2021-06-23 LAB — BASIC METABOLIC PANEL
Anion gap: 5 (ref 5–15)
BUN: 19 mg/dL (ref 8–23)
CO2: 34 mmol/L — ABNORMAL HIGH (ref 22–32)
Calcium: 7.8 mg/dL — ABNORMAL LOW (ref 8.9–10.3)
Chloride: 98 mmol/L (ref 98–111)
Creatinine, Ser: 0.68 mg/dL (ref 0.61–1.24)
GFR, Estimated: >60 mL/min (ref >60)
Glucose, Bld: 134 mg/dL — ABNORMAL HIGH (ref 70–99)
Potassium: 2.9 mmol/L — ABNORMAL LOW (ref 3.5–5.1)
Sodium: 137 mmol/L (ref 135–145)

## 2021-06-24 LAB — AEROBIC/ANAEROBIC CULTURE W GRAM STAIN (SURGICAL/DEEP WOUND)

## 2021-06-24 LAB — COMPREHENSIVE METABOLIC PANEL
ALT: 23 U/L (ref 0–44)
AST: 19 U/L (ref 15–41)
Albumin: 2.4 g/dL — ABNORMAL LOW (ref 3.5–5.0)
Alkaline Phosphatase: 73 U/L (ref 38–126)
Anion gap: 6 (ref 5–15)
BUN: 18 mg/dL (ref 8–23)
CO2: 31 mmol/L (ref 22–32)
Calcium: 8.1 mg/dL — ABNORMAL LOW (ref 8.9–10.3)
Chloride: 99 mmol/L (ref 98–111)
Creatinine, Ser: 0.78 mg/dL (ref 0.61–1.24)
GFR, Estimated: >60 mL/min (ref >60)
Glucose, Bld: 81 mg/dL (ref 70–99)
Potassium: 3.4 mmol/L — ABNORMAL LOW (ref 3.5–5.1)
Sodium: 136 mmol/L (ref 135–145)
Total Bilirubin: 0.5 mg/dL (ref 0.3–1.2)
Total Protein: 4.7 g/dL — ABNORMAL LOW (ref 6.5–8.1)

## 2021-06-24 LAB — PHOSPHORUS: Phosphorus: 3.4 mg/dL (ref 2.5–4.6)

## 2021-06-24 LAB — POTASSIUM: Potassium: 3.8 mmol/L (ref 3.5–5.1)

## 2021-06-24 LAB — MAGNESIUM: Magnesium: 2.1 mg/dL (ref 1.7–2.4)

## 2021-06-24 LAB — TRIGLYCERIDES: Triglycerides: 92 mg/dL (ref <150)

## 2021-06-25 LAB — CULTURE, BLOOD (ROUTINE X 2)
Culture: NO GROWTH
Culture: NO GROWTH
Special Requests: ADEQUATE
Special Requests: ADEQUATE

## 2021-06-26 LAB — BASIC METABOLIC PANEL
Anion gap: 9 (ref 5–15)
BUN: 23 mg/dL (ref 8–23)
CO2: 31 mmol/L (ref 22–32)
Calcium: 8.3 mg/dL — ABNORMAL LOW (ref 8.9–10.3)
Chloride: 99 mmol/L (ref 98–111)
Creatinine, Ser: 0.93 mg/dL (ref 0.61–1.24)
GFR, Estimated: >60 mL/min (ref >60)
Glucose, Bld: 90 mg/dL (ref 70–99)
Potassium: 3.8 mmol/L (ref 3.5–5.1)
Sodium: 139 mmol/L (ref 135–145)

## 2021-06-27 LAB — TRIGLYCERIDES: Triglycerides: 88 mg/dL (ref <150)

## 2021-06-27 LAB — MAGNESIUM: Magnesium: 1.8 mg/dL (ref 1.7–2.4)

## 2021-06-27 LAB — PHOSPHORUS: Phosphorus: 3.9 mg/dL (ref 2.5–4.6)

## 2021-06-28 LAB — CBC
HCT: 34.9 % — ABNORMAL LOW (ref 39.0–52.0)
Hemoglobin: 11.1 g/dL — ABNORMAL LOW (ref 13.0–17.0)
MCH: 26.7 pg (ref 26.0–34.0)
MCHC: 31.8 g/dL (ref 30.0–36.0)
MCV: 84.1 fL (ref 80.0–100.0)
Platelets: 150 10*3/uL (ref 150–400)
RBC: 4.15 MIL/uL — ABNORMAL LOW (ref 4.22–5.81)
RDW: 21.9 % — ABNORMAL HIGH (ref 11.5–15.5)
WBC: 7.7 10*3/uL (ref 4.0–10.5)
nRBC: 0 % (ref 0.0–0.2)

## 2021-06-28 LAB — BASIC METABOLIC PANEL
Anion gap: 8 (ref 5–15)
BUN: 17 mg/dL (ref 8–23)
CO2: 30 mmol/L (ref 22–32)
Calcium: 7.9 mg/dL — ABNORMAL LOW (ref 8.9–10.3)
Chloride: 100 mmol/L (ref 98–111)
Creatinine, Ser: 0.74 mg/dL (ref 0.61–1.24)
GFR, Estimated: >60 mL/min (ref >60)
Glucose, Bld: 125 mg/dL — ABNORMAL HIGH (ref 70–99)
Potassium: 3.1 mmol/L — ABNORMAL LOW (ref 3.5–5.1)
Sodium: 138 mmol/L (ref 135–145)

## 2021-06-28 LAB — POTASSIUM: Potassium: 3.5 mmol/L (ref 3.5–5.1)

## 2021-06-28 NOTE — Progress Notes (Signed)
Referring Physician(s): Dr. Manson Passey, S.   Supervising Physician: Richarda Overlie  Patient Status:  [facility]   Chief Complaint:  S/p RLQ intraabdominal fluid collection drain placement with IR on 06/20/21.   Subjective:  Patient laying in bed, not in acute distress. Reports that the drain was flushed once.  Reports mild abdominal pain around the drain site, he says there has been quite of leakage from the drain site.  Allergies: Patient has no allergy information on record.  Medications: Prior to Admission medications   Not on File     Vital Signs: BP 124/74 (BP Location: Right Arm)   Pulse 66   Resp 13   SpO2 96%   Physical Exam Vitals reviewed.  Constitutional:      General: He is not in acute distress. HENT:     Head: Normocephalic and atraumatic.  Pulmonary:     Effort: Pulmonary effort is normal.  Abdominal:     General: Abdomen is flat. There is no distension.     Palpations: Abdomen is soft.  Musculoskeletal:     Cervical back: Neck supple.  Skin:    General: Skin is warm and dry.     Comments: Positive RLQ drain to a gravity bag. Small amount of purulent drainage noted around the drain. Dressing is wet. Site with no  erythema, edema, tenderness, bleeding. Suture and stat lock in place. 10 ml of  feculent fluid noted in the bag. Drain aspirates and flushes well, leakage around the drain noted when it was flushed.   Neurological:     Mental Status: He is alert and oriented to person, place, and time.  Psychiatric:        Mood and Affect: Mood normal.        Behavior: Behavior normal.        Judgment: Judgment normal.    Imaging: No results found.  Labs:  CBC: Recent Labs    06/05/21 0542 06/08/21 0417 06/18/21 0425 06/28/21 0433  WBC 16.8* 17.9* 17.2* 7.7  HGB 11.5* 11.1* 11.5* 11.1*  HCT 38.2* 36.1* 36.1* 34.9*  PLT 266 205 128* 150    COAGS: No results for input(s): INR, APTT in the last 8760 hours.  BMP: Recent Labs     06/23/21 0612 06/24/21 0727 06/24/21 1037 06/26/21 0456 06/28/21 0433  NA 137  --  136 139 138  K 2.9* 3.8 3.4* 3.8 3.1*  CL 98  --  99 99 100  CO2 34*  --  31 31 30   GLUCOSE 134*  --  81 90 125*  BUN 19  --  18 23 17   CALCIUM 7.8*  --  8.1* 8.3* 7.9*  CREATININE 0.68  --  0.78 0.93 0.74  GFRNONAA >60  --  >60 >60 >60    LIVER FUNCTION TESTS: Recent Labs    06/15/21 0954 06/18/21 1058 06/21/21 0953 06/24/21 1037  BILITOT 0.6 0.6 0.4 0.5  AST 15 18 15 19   ALT 21 25 26 23   ALKPHOS 73 88 74 73  PROT 5.2* 5.2* 4.9* 4.7*  ALBUMIN 2.5* 2.6* 2.5* 2.4*    Assessment and Plan:  65 y.o. male with history of a-flutter, HTN, COVID,  chronic pain, esophageal stricture, perforated gallbladder, anemia, COPD s/p multiple abdominal surgeries including ex-lap, lysis of adhesions, enterocutaneous fistula, peripancreatic abscesses, perforated peptic ulcers, bile leaks resulting in short bowel syndrome. CT AP from 7.12.22 showed residual fluid collection suspicious for abscess in the RIGHT lower quadrant appears separate from  the gallbladder but there was a tube either in the gallbladder or in this fluid collection on previous plain film and cholecystostomy tube was referenced at [facility] based on report from 05/16/2021. Tract extends along the body wall likely from previous catheter. Patient s/p RLQ drain placement with IR on 06/20/21.   Patient somehow fell off IR rounding list, never seen by IR APP after the drain placement.  Today physical exam notable for purulent leakage around the drain site, and leakage around the drain with flushing.  Patient reports mild abdominal pain around the drain site.  CBC today showed no leukocytosis.   Discussed with Dr. Lowella Dandy, will schedule for CT AP with contrast this weekend to evaluate the drain.  Will review CT once done.   Continue with flushing q shift, output recording q shift and dressing changes as needed.   Further treatment plan per  [facility]. Appreciate and agree with the plan.  IR to follow.    Electronically Signed: Willette Brace, PA-C 06/28/2021, 2:45 PM   I spent a total of 25 Minutes at the the patient's bedside AND on the patient's hospital floor or unit, greater than 50% of which was counseling/coordinating care for RLQ drain.

## 2021-06-29 ENCOUNTER — Other Ambulatory Visit (HOSPITAL_COMMUNITY): Payer: Medicare Other

## 2021-06-29 LAB — BASIC METABOLIC PANEL
Anion gap: 9 (ref 5–15)
BUN: 16 mg/dL (ref 8–23)
CO2: 27 mmol/L (ref 22–32)
Calcium: 8.4 mg/dL — ABNORMAL LOW (ref 8.9–10.3)
Chloride: 102 mmol/L (ref 98–111)
Creatinine, Ser: 0.78 mg/dL (ref 0.61–1.24)
GFR, Estimated: >60 mL/min (ref >60)
Glucose, Bld: 69 mg/dL — ABNORMAL LOW (ref 70–99)
Potassium: 3.7 mmol/L (ref 3.5–5.1)
Sodium: 138 mmol/L (ref 135–145)

## 2021-06-29 MED ORDER — IOHEXOL 300 MG/ML  SOLN
100.0000 mL | Freq: Once | INTRAMUSCULAR | Status: AC | PRN
  Administered 2021-06-29: 100 mL via INTRAVENOUS

## 2021-06-29 NOTE — Progress Notes (Addendum)
Omar Hernandez is a 65 y.o. male s/p image guided RLQ drain placement with IR on 06/20/21.   Patient evaluated yesterday, purulent drainage noted around the drain. Leakage noted with flushing as well. Patient is afebrile and WBC count normal.   Obtained CT A/P, reviewed with Dr. Lowella Dandy.  RLQ abscess appears to be resolved, but will need drain injection to r/o fistula.   Informed Dr. Manson Passey that we will attempt to schedule him for drain injection early next week as patient has pending discharge order, and patient is wishing to go to [facility] after the discharge.   Patient is tentatively scheduled for drain injection with possible drain removal early next week pending IR schedule.   Meanwhile, continue with flushing TID, output recording q shift and dressing changes as needed. Please call IR for questions and concerns.   Lynann Bologna Sereniti Wan PA-C 06/29/2021 10:54 AM

## 2021-06-30 LAB — PHOSPHORUS: Phosphorus: 3.4 mg/dL (ref 2.5–4.6)

## 2021-06-30 LAB — TRIGLYCERIDES: Triglycerides: 74 mg/dL (ref <150)

## 2021-06-30 LAB — MAGNESIUM: Magnesium: 1.9 mg/dL (ref 1.7–2.4)

## 2021-07-01 ENCOUNTER — Other Ambulatory Visit (HOSPITAL_COMMUNITY): Payer: Medicare Other

## 2021-07-01 HISTORY — PX: IR SINUS/FIST TUBE CHK-NON GI: IMG673

## 2021-07-01 LAB — CBC
HCT: 42.8 % (ref 39.0–52.0)
Hemoglobin: 13.6 g/dL (ref 13.0–17.0)
MCH: 27.5 pg (ref 26.0–34.0)
MCHC: 31.8 g/dL (ref 30.0–36.0)
MCV: 86.6 fL (ref 80.0–100.0)
Platelets: 162 10*3/uL (ref 150–400)
RBC: 4.94 MIL/uL (ref 4.22–5.81)
RDW: 22 % — ABNORMAL HIGH (ref 11.5–15.5)
WBC: 16.5 10*3/uL — ABNORMAL HIGH (ref 4.0–10.5)
nRBC: 0 % (ref 0.0–0.2)

## 2021-07-01 LAB — BASIC METABOLIC PANEL
Anion gap: 11 (ref 5–15)
BUN: 15 mg/dL (ref 8–23)
CO2: 29 mmol/L (ref 22–32)
Calcium: 8.6 mg/dL — ABNORMAL LOW (ref 8.9–10.3)
Chloride: 96 mmol/L — ABNORMAL LOW (ref 98–111)
Creatinine, Ser: 0.78 mg/dL (ref 0.61–1.24)
GFR, Estimated: >60 mL/min (ref >60)
Glucose, Bld: 141 mg/dL — ABNORMAL HIGH (ref 70–99)
Potassium: 3.3 mmol/L — ABNORMAL LOW (ref 3.5–5.1)
Sodium: 136 mmol/L (ref 135–145)

## 2021-07-01 MED ORDER — IOHEXOL 300 MG/ML  SOLN
50.0000 mL | Freq: Once | INTRAMUSCULAR | Status: AC | PRN
  Administered 2021-07-01: 10 mL

## 2021-07-01 NOTE — Procedures (Signed)
Interventional Radiology Procedure Note  Procedure: RLQ drain injection and removal  Findings: Please refer to procedural dictation for full description.  Complications: None immediate  Estimated Blood Loss: <5 mL  Recommendations: Follow up with IR as needed.   Marliss Coots, MD

## 2021-07-02 LAB — BASIC METABOLIC PANEL
Anion gap: 7 (ref 5–15)
BUN: 15 mg/dL (ref 8–23)
CO2: 28 mmol/L (ref 22–32)
Calcium: 8.2 mg/dL — ABNORMAL LOW (ref 8.9–10.3)
Chloride: 101 mmol/L (ref 98–111)
Creatinine, Ser: 0.71 mg/dL (ref 0.61–1.24)
GFR, Estimated: >60 mL/min (ref >60)
Glucose, Bld: 144 mg/dL — ABNORMAL HIGH (ref 70–99)
Potassium: 3.9 mmol/L (ref 3.5–5.1)
Sodium: 136 mmol/L (ref 135–145)

## 2021-07-03 LAB — TRIGLYCERIDES: Triglycerides: 98 mg/dL (ref <150)

## 2021-07-04 NOTE — Progress Notes (Signed)
PROGRESS NOTE    Omar Hernandez  XLK:[id number] DOB: 1956/02/05 DOA: 05/23/2021  Brief Narrative:  Omar Hernandez is an 65 y.o. 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  He had pain around percutaneous cholecystostomy.  Patient at baseline is on oxygen 4 L.  In the emergency room he was noted to have significant leukocytosis.  He was also anemic with hemoglobin of 6.8 and was given 1 unit PRBCs.  Chest x-ray showed a patchy opacities in the right mid and lower lung.  Head CT was negative for any intracranial abnormality.  CT imaging showed multifocal pneumonia without evidence of acute cholecystitis.  He received treatment with IV vancomycin, cefepime.  He was also treated with steroids.  His sputum cultures showed MRSA.  He was also diuresed with Lasix.  He had a history of multiple abdominal surgeries as mentioned above percutaneous cholecystostomy due to ruptured gallbladder, concern for short gut syndrome.  GI was consulted.  For short gut syndrome and possible PEG tube placement.  Per GI at the acute facility unsure if PEG tube placement would offer much benefit.  Patient was placed on TPN for nutrition.  Due to his complex medical problems he was transferred and admitted to [facility].  CT of the abdomen pelvis done on 06/18/2021 per report resident fluid collection suspicious for abscess in the right lower quadrant separate from the gallbladder with tracts extending along the body wall likely from previous catheter. He is c/o cough.  Assessment & Plan:  Active Problems: Acute on chronic respiratory failure with hypoxemia Multifocal  pneumonia Intra-abdominal abscess Leukocytosis Enterocutaneous fistula COPD History of short gut syndrome and status post multiple abdominal surgeries Percutaneous cholecystostomy secondary to ruptured gallbladder Atrial fibrillation BPH   Acute on chronic respiratory failure: Likely secondary to multifocal pneumonia.  He received treatment with antibiotics at the acute facility.  Here he received treatment with IV vancomycin.  Subsequently received treatment with cefepime and Flagyl as well.  Currently antibiotics de-escalated to ceftriaxone.  He also received treatment with Flagyl.  He had a CT of the abdomen and pelvis which per report showing new mucous plugging in the right lower lung airways concerning for ongoing aspiration and pneumonia. Remains on oxygen by nasal cannula. If his respiratory status worsens suggest to repeat chest imaging preferably chest CT which can be done without contrast if concern for renal compromise.  Per primary team recommending to switch to oral antibiotics.  Therefore suggest to switch to Cipro and Flagyl for 5-7 days pending improvement and complete the treatment.   Intra-abdominal abscess: He had a CT imaging done which showed findings concerning for intra-abdominal abscess.  He received treatment with IV Zosyn.  The abscess cultures showed E. coli.  Based on the susceptibilities switched to ceftriaxone and Flagyl.  Primary team requesting to switch to oral antibiotics for discharge planning.  Therefore recommend to switch to Cipro and Flagyl and treat for 5 to 7 days.  Repeat CT of the abdomen and pelvis on 06/29/2021 showing that the abscess had improved.  Leukocytosis: Likely secondary to intra-abdominal abscess.  He was on TPN.  Received treatment with multiple antibiotics.  Currently on ceftriaxone, fluconazole.  Switching to p.o. Cipro, Flagyl for a duration of 5 to 7 days  to complete the treatment.   Enterocutaneous fistula: Continue management per the  primary team.   COPD: Continue medication and management per primary team.   History of short gut syndrome status post multiple abdominal surgeries: Currently has a dressing in place.  Antibiotics as mentioned above.  Continue management per the primary team.   Percutaneous cholecystostomy secondary to ruptured gallbladder: He previously had a drain.  This has been removed.  Now unfortunately intra-abdominal abscess.  IR consulted for drainage of the abscess.  Recommend to send for abscess cultures.  On antibiotics as mentioned above.  Follow-up on the culture results and adjust antibiotics accordingly.   Atrial fibrillation pulmonary medications and management per the primary team.   BPH: He is on Flomax.  Due to BPH he is high risk for urinary retention and UTI.  If he starts having worsening fevers or worsening leukocytosis suggest to send for pancultures.   Due to his complex medical problems he is high risk for worsening and decompensation.  Plan of care discussed with the primary team and pharmacy.  Plan of care also discussed with the patient.  Subjective: He is complaining of cough.  Objective: Vitals: Pulse 96, respiratory rate 20, blood pressure 134/69, pulse oximetry 96%  Examination: Constitutional: Patient awake and oriented, not in any acute distress at this time Head: Atraumatic, normocephalic Eyes: PERLA, EOMI, irises appear normal, anicteric sclera, ENMT: external ears and nose appear normal, normal hearing, moist oral mucosa  Neck: Supple CVS: S1-S2 Respiratory: Coarse breath sounds, rhonchi, no wheezing Abdomen: Soft, nonspecific tenderness without any guarding or rebound, enterocutaneous fistula, abdominal  dressing in place Musculoskeletal: No edema Neuro: Debility with generalized weakness otherwise grossly nonfocal Psych: Stable Skin: no rashes   Data Reviewed: I have personally reviewed following labs and imaging studies  CBC: Recent Labs  Lab  06/28/21 0433 07/01/21 1225  WBC 7.7 16.5*  HGB 11.1* 13.6  HCT 34.9* 42.8  MCV 84.1 86.6  PLT 150 162    Basic Metabolic Panel: Recent Labs  Lab 06/28/21 0433 06/28/21 1838 06/29/21 0623 06/30/21 0444 07/01/21 1225 07/02/21 0437  NA 138  --  138  --  136 136  K 3.1* 3.5 3.7  --  3.3* 3.9  CL 100  --  102  --  96* 101  CO2 30  --  27  --  29 28  GLUCOSE 125*  --  69*  --  141* 144*  BUN 17  --  16  --  15 15  CREATININE 0.74  --  0.78  --  0.78 0.71  CALCIUM 7.9*  --  8.4*  --  8.6* 8.2*  MG  --   --   --  1.9  --   --   PHOS  --   --   --  3.4  --   --     GFR: CrCl cannot be calculated (Unknown ideal weight.).  Liver Function Tests: No results for input(s): AST, ALT, ALKPHOS, BILITOT, PROT, ALBUMIN in the last 168 hours.  CBG: No results for input(s): GLUCAP in the last 168 hours.   No results found for this or any previous visit (from the past 240 hour(s)).    Radiology Studies: No results found.   Scheduled Meds: Please see MAR   Vonzella Nipple, MD 07/04/2021, 6:54 PM

## 2021-07-05 LAB — CBC
HCT: 42.5 % (ref 39.0–52.0)
Hemoglobin: 13.8 g/dL (ref 13.0–17.0)
MCH: 27.7 pg (ref 26.0–34.0)
MCHC: 32.5 g/dL (ref 30.0–36.0)
MCV: 85.2 fL (ref 80.0–100.0)
Platelets: 172 10*3/uL (ref 150–400)
RBC: 4.99 MIL/uL (ref 4.22–5.81)
RDW: 21.3 % — ABNORMAL HIGH (ref 11.5–15.5)
WBC: 9.5 10*3/uL (ref 4.0–10.5)
nRBC: 0 % (ref 0.0–0.2)

## 2021-07-05 LAB — BASIC METABOLIC PANEL
Anion gap: 9 (ref 5–15)
BUN: 22 mg/dL (ref 8–23)
CO2: 27 mmol/L (ref 22–32)
Calcium: 8.5 mg/dL — ABNORMAL LOW (ref 8.9–10.3)
Chloride: 102 mmol/L (ref 98–111)
Creatinine, Ser: 0.86 mg/dL (ref 0.61–1.24)
GFR, Estimated: >60 mL/min (ref >60)
Glucose, Bld: 97 mg/dL (ref 70–99)
Potassium: 3.4 mmol/L — ABNORMAL LOW (ref 3.5–5.1)
Sodium: 138 mmol/L (ref 135–145)

## 2021-07-05 LAB — POTASSIUM: Potassium: 5.3 mmol/L — ABNORMAL HIGH (ref 3.5–5.1)

## 2021-07-06 LAB — TRIGLYCERIDES: Triglycerides: 106 mg/dL (ref <150)

## 2021-07-06 LAB — POTASSIUM: Potassium: 3.9 mmol/L (ref 3.5–5.1)

## 2021-12-08 DEATH — deceased

## 2022-11-25 IMAGING — CT CT ABD-PELV W/ CM
2 of 5 series · 16 of 46 positions shown, 18 images · IV contrast (APPLIED)
Comparison: 06/18/2021

CLINICAL DATA: Right lower quadrant abdominal pain. CT-guided drain
placed into right abdominal abscess on 06/20/2021.

EXAM:
CT ABDOMEN AND PELVIS WITH CONTRAST
TECHNIQUE: Multidetector CT imaging of the abdomen and pelvis was performed
using the standard protocol following bolus administration of
intravenous contrast.
CONTRAST:  100mL OMNIPAQUE IOHEXOL 300 MG/ML  SOLN

[Series 3: abd/ pelvis 5.0 i30f 2 (person_name) · axial · 0.86mm/px · z∈[+1177,+1492]mm · 13 of 73 slices shown, 15 images]
[im 5/73  soft-tissue]
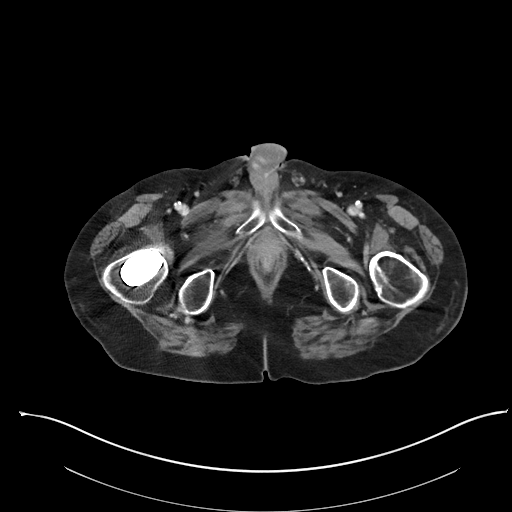
[im 5/73  bone]
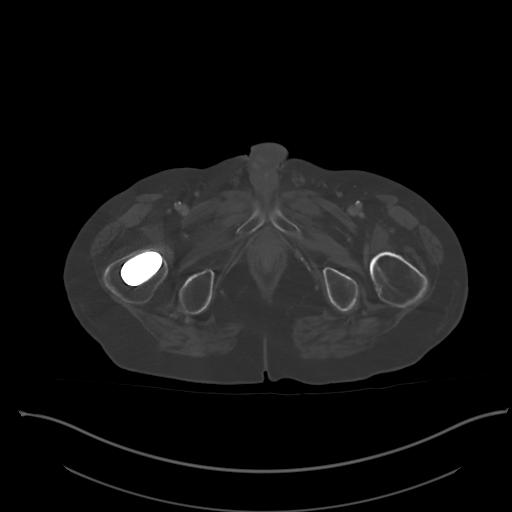
[im 9/73  soft-tissue]
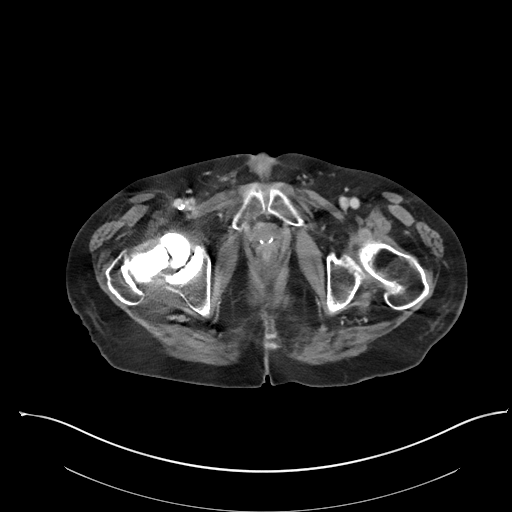
[im 17/73  soft-tissue]
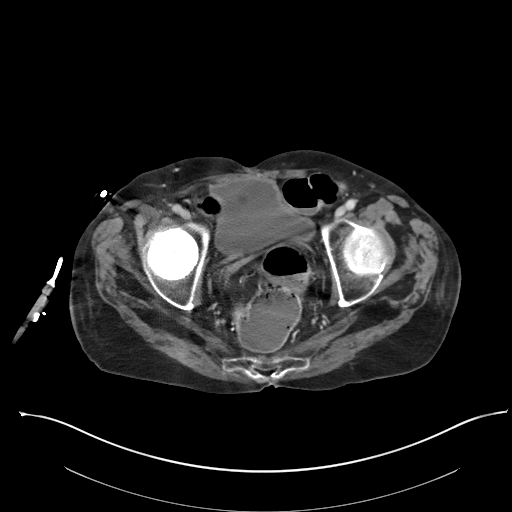
[im 22/73  soft-tissue]
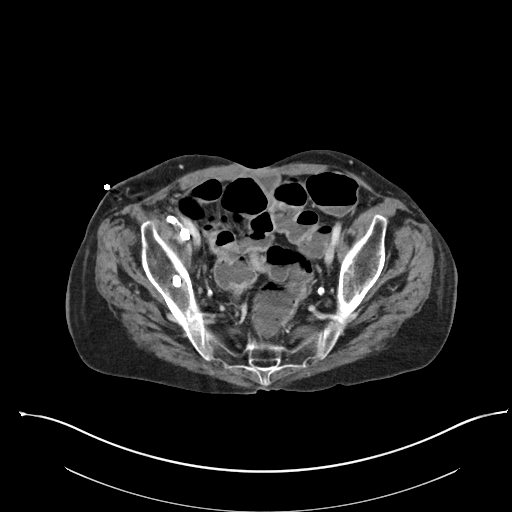
[im 26/73  soft-tissue]
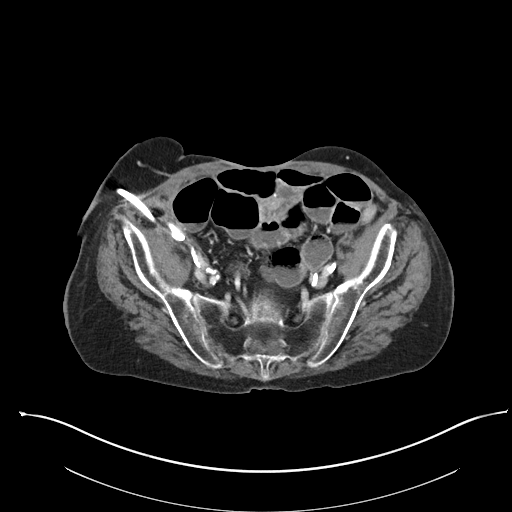
[im 30/73  soft-tissue]
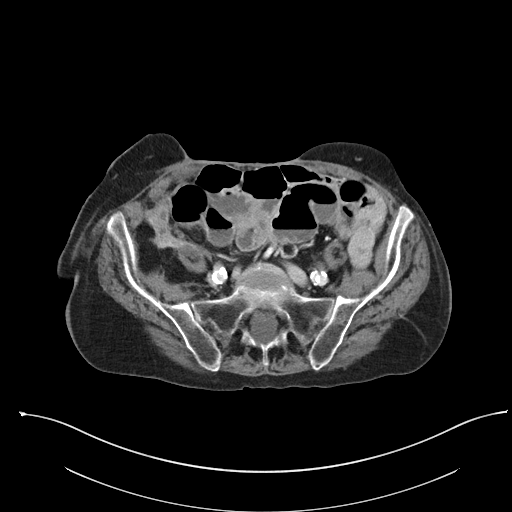
[im 39/73  soft-tissue]
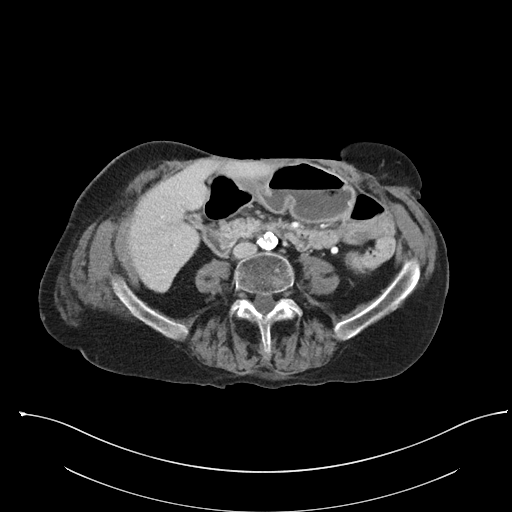
[im 43/73  soft-tissue]
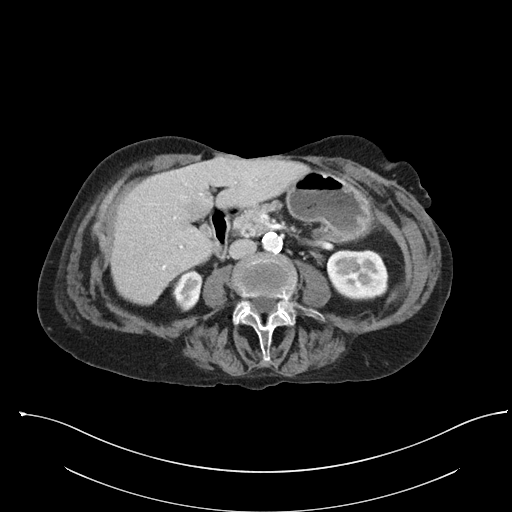
[im 47/73  soft-tissue]
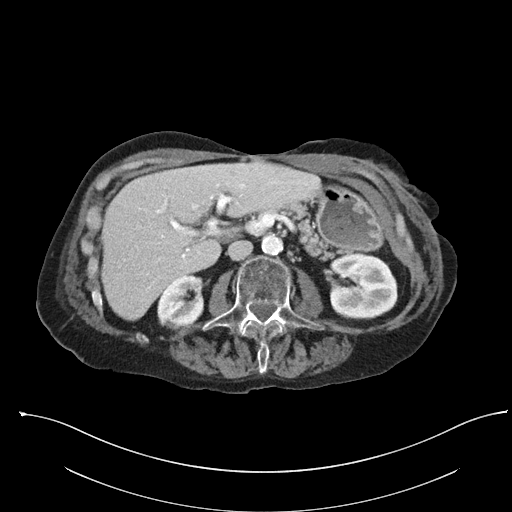
[im 47/73  bone]
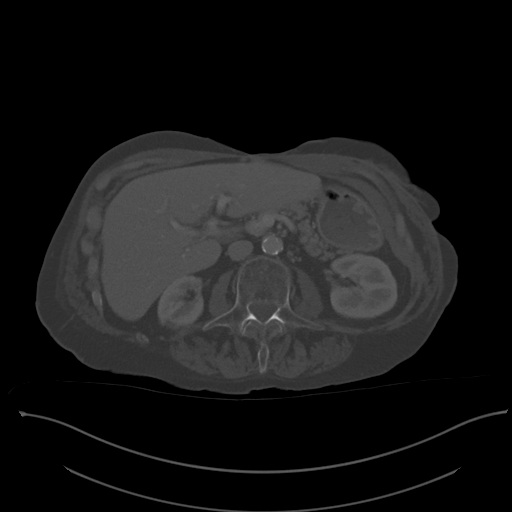
[im 51/73  soft-tissue]
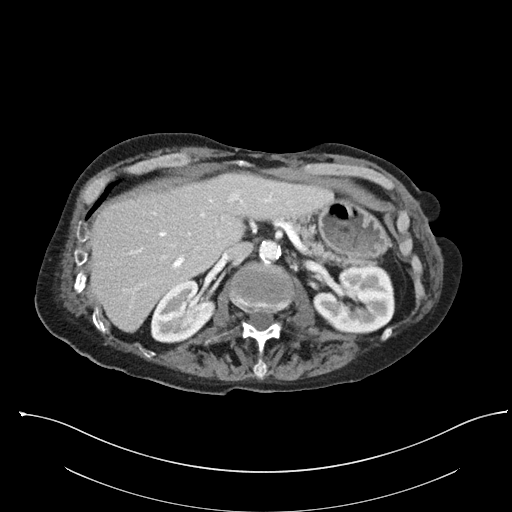
[im 56/73  soft-tissue]
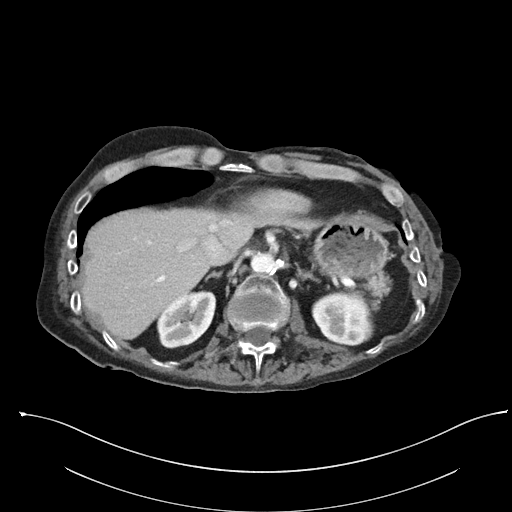
[im 64/73  soft-tissue]
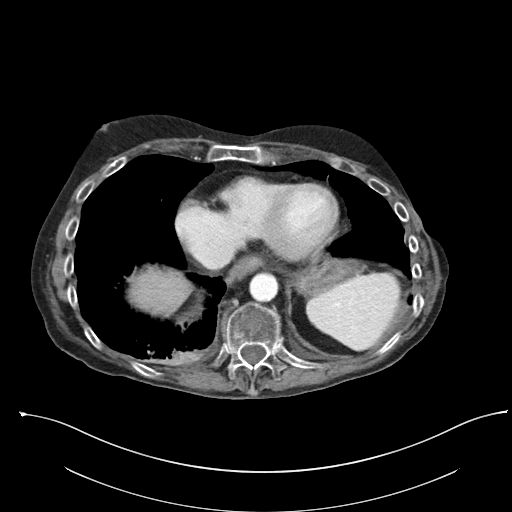
[im 68/73  soft-tissue]
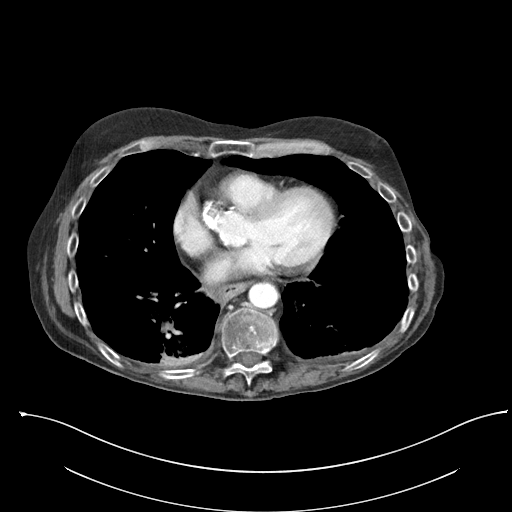

[Series 6: coronal soft tissue · coronal · 0.77mm/px · 3 of 81 slices shown]
[im 27/81  soft-tissue]
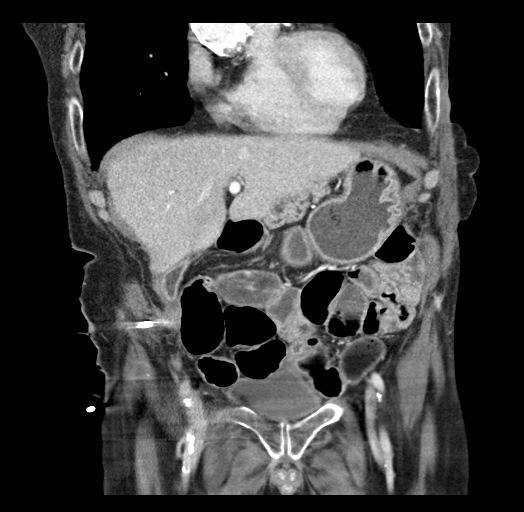
[im 36/81  soft-tissue]
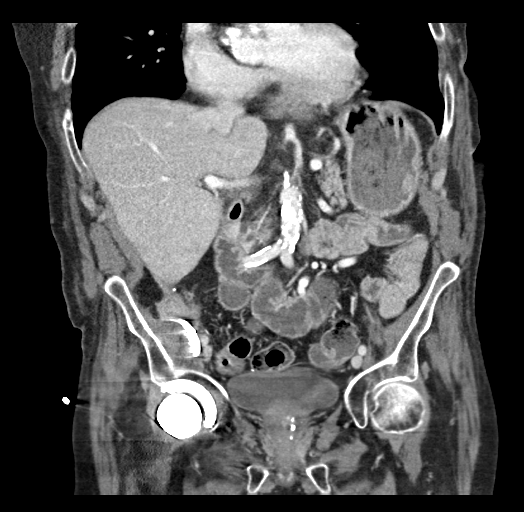
[im 45/81  soft-tissue]
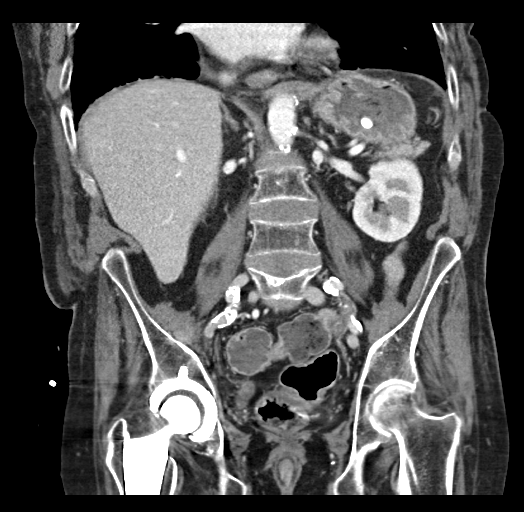

[16 of 46 positions shown; findings below may reference images not displayed]

FINDINGS: Lower chest: Dependent atelectasis at the lung bases. Evidence for
mucous plugging within right lower lobe airways on sequence 5, image
7. This is new since previous examination. Visualized ascending
thoracic aorta is heavily calcified.

Hepatobiliary: Normal appearance of the liver without biliary
dilatation or focal liver lesion. Main portal venous system is
patent. Gallbladder is decompressed and adjacent to the right lower
quadrant drain. Stable appearance of the gallbladder.

Pancreas: Unremarkable. No pancreatic ductal dilatation or
surrounding inflammatory changes.

Spleen: Normal in size without focal abnormality.

Adrenals/Urinary Tract: Normal adrenal glands. Normal appearance of
both kidneys without hydronephrosis. Normal appearance of the
urinary bladder.

Stomach/Bowel: Evidence for colectomy with possible small bowel to
rectal anastomosis. Rectum is mildly distended with fluid. There are
fluid-filled loops of small bowel throughout the abdomen. No
evidence for an obstructive process. No focal bowel inflammation.
Mild wall thickening near the presumed surgical anastomosis could be
related to incomplete distension.

Vascular/Lymphatic: Extensive atherosclerotic calcifications in the
aorta and iliac arteries. Negative for an abdominal aortic aneurysm.
No lymph node enlargement in the abdomen or pelvis.

Reproductive: Prostate is small with calcifications.

Other: Percutaneous drain has been placed in the right lower
quadrant fluid collection. This fluid collection has essentially
resolved. There may be trace fluid along the superior aspect of the
collection. The collection is decompressed. Drain is positioned
along the inferior aspect of the right iliopsoas muscle and just
posterior to the distal right external iliac vessels. No new abscess
collections. Negative for free fluid. Negative for free air.

Musculoskeletal: Lack of subcutaneous tissue along the anterior
abdominal wall likely related to prior surgery. Right hip
replacement is located. Multiple vertebral body compression
deformities. Compression deformities involving T12, L1, L2, L3 and
L5. These compression deformities/fractures are similar to the
recent comparison examination.
IMPRESSION: 1. Right lower quadrant abscess has essentially resolved following
placement of the percutaneous drain. No new abscess or suspicious
fluid collections within the abdomen or pelvis.
2. Postsurgical changes as described.
3. New mucous plugging in right lower lung airways.
4.  Aortic Atherosclerosis ([id number]).
5. Multiple vertebral body compression deformities of unknown age.

## 2022-11-27 IMAGING — XA IR FISTULA/SINUS TRACT
3 series · 8 of 8 positions shown · IV contrast (IODINE)
Comparison: 06/20/2021, 06/29/2021

CLINICAL DATA: 65-year-old male with history of right lower
quadrant abscess status post percutaneous drain placement on
06/20/2021. CT abdomen pelvis on 06/29/2021 demonstrates trace
residual fluid collection. There is minimal output from the drain.

EXAM:
SINUS TRACT INJECTION/FISTULOGRAM
TECHNIQUE: The patient was positioned supine on the fluoroscopy table.
A preprocedural spot fluoroscopic image was obtained of the right
lower quadrant and the existing percutaneous drainage catheter.
Multiple spot fluoroscopic and radiographic images were obtained
following the injection of a small amount of contrast via the
existing percutaneous drainage catheter.

[Series 1: body 4 care · 3 of 4 frames shown (1 of 2)]
[frame 1/4]
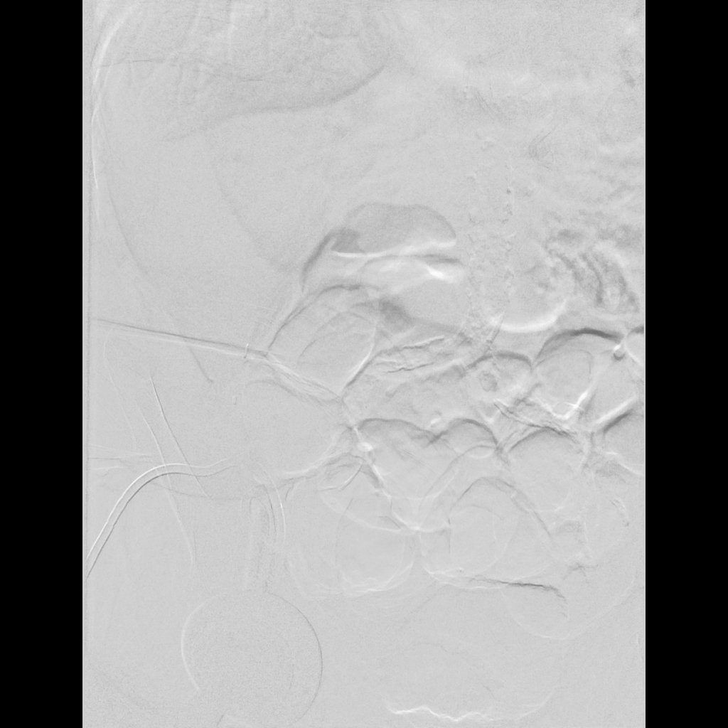
[frame 3/4]
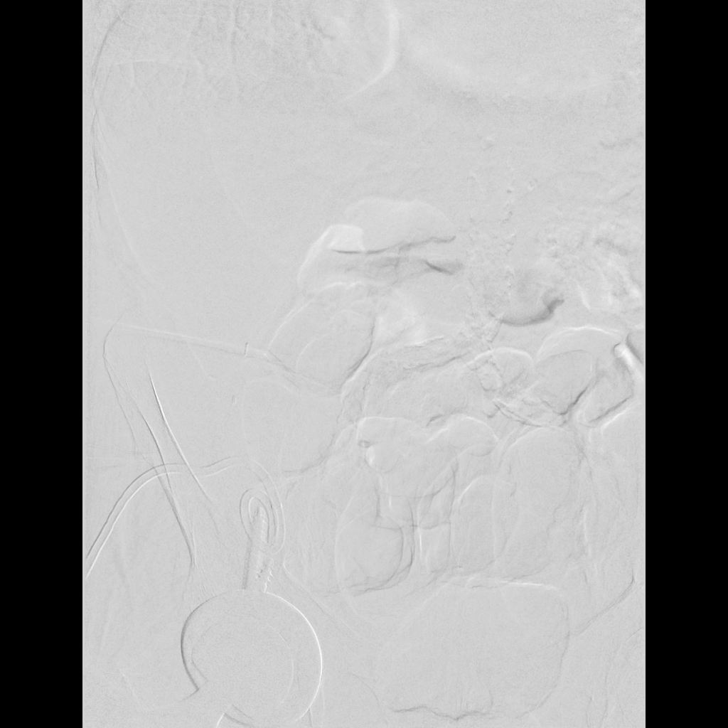
[frame 4/4]
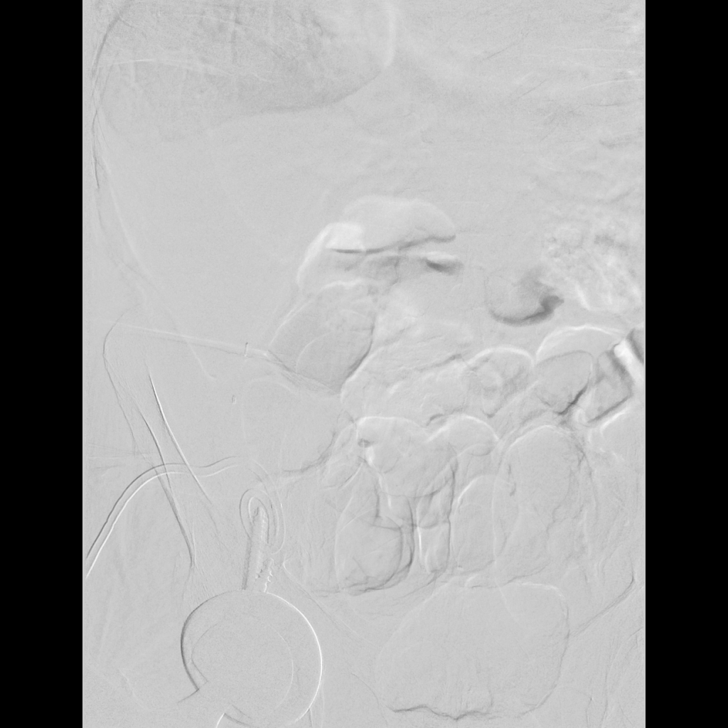

[Series 2: body 4 care · 3 of 4 frames shown (2 of 2)]
[frame 1/4]
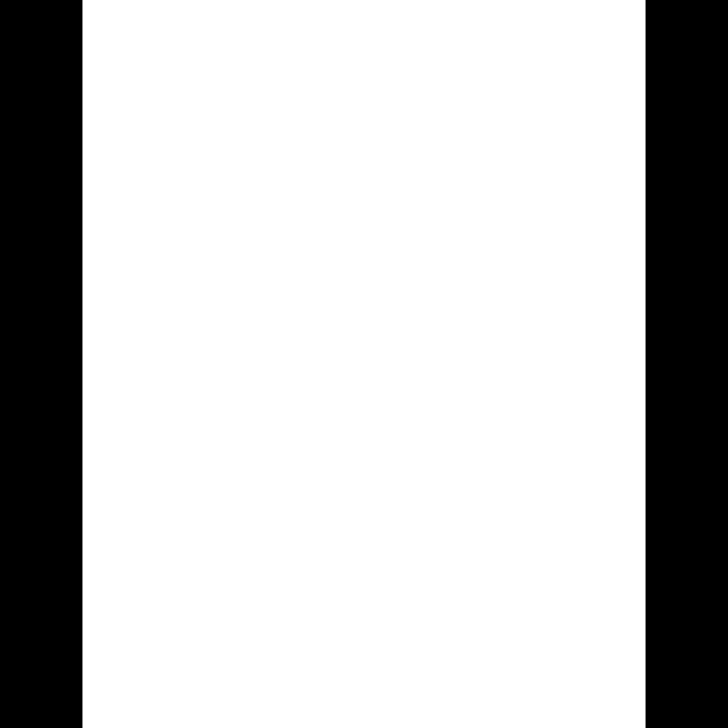
[frame 3/4]
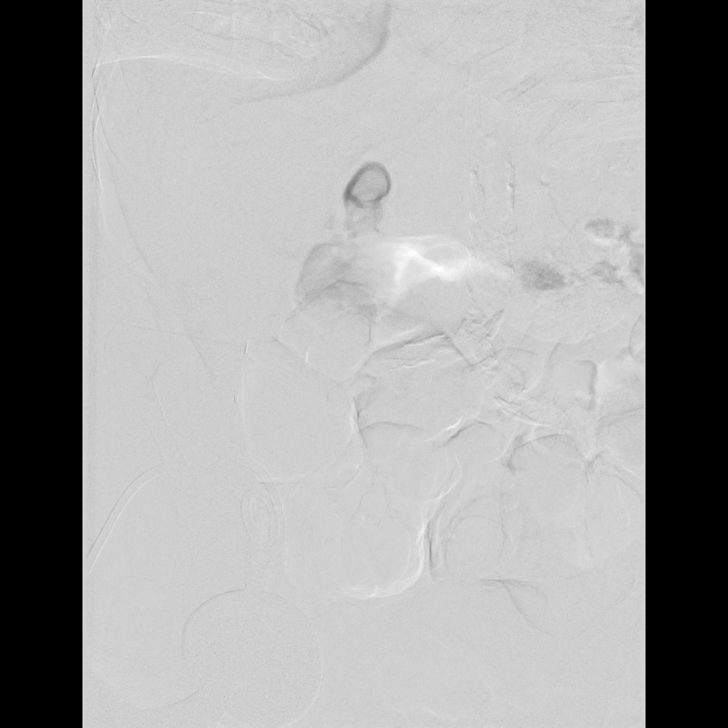
[frame 4/4]
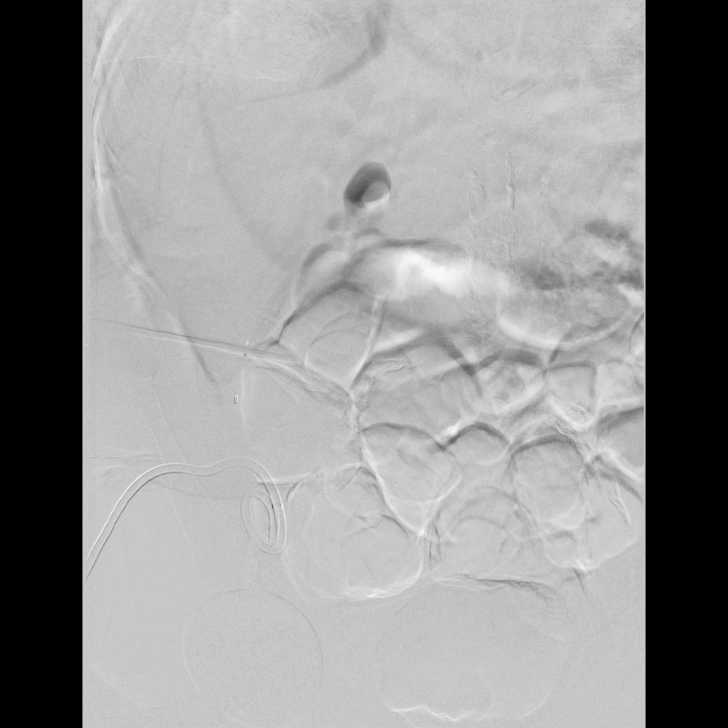

[Series 3: fl (-) angio · 2 of 2 slices shown]
[im 1/2]
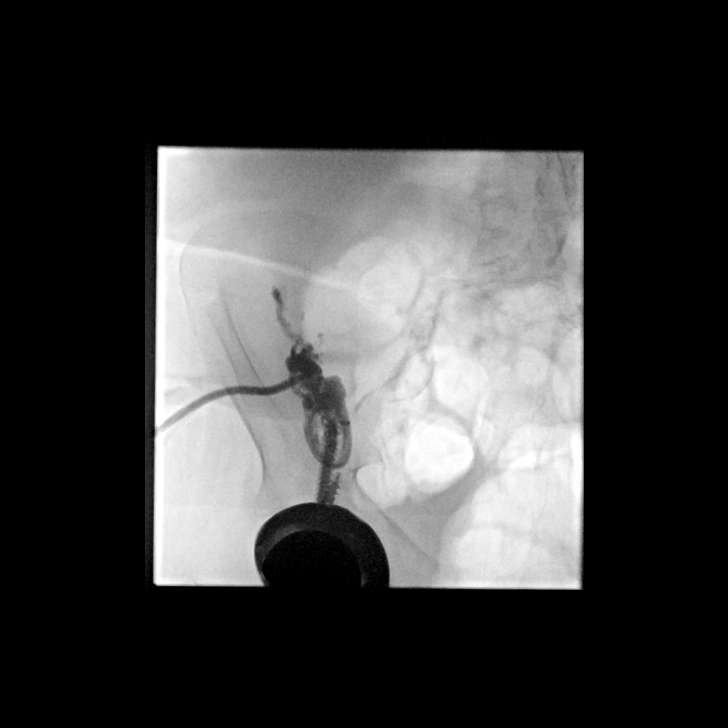
[im 2/2]
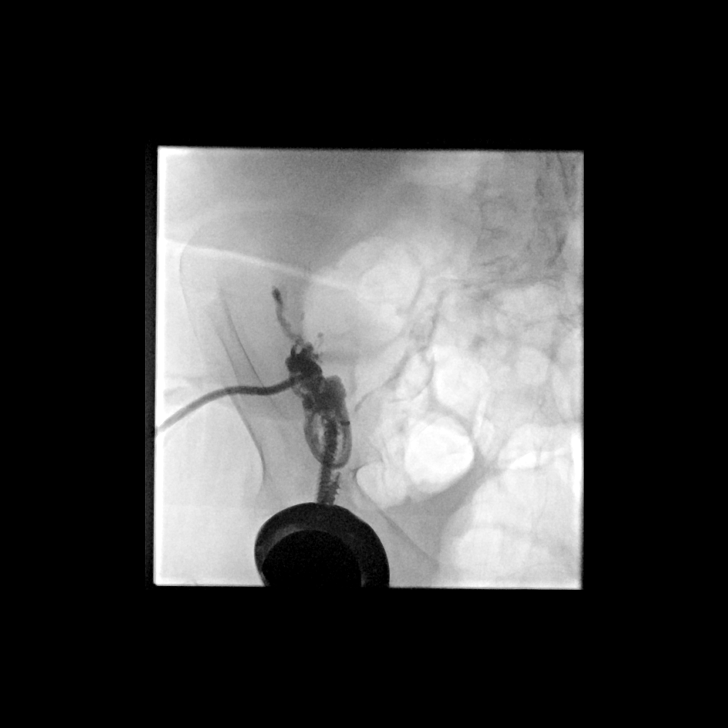

[8 of 8 positions shown; findings below may reference images not displayed]

CONTRAST:  Ten mL Omnipaque 300-administered via the existing
percutaneous drain.

FLUOROSCOPY TIME:  0.5 minutes, 3.5 mGy
FINDINGS: Minimal residual fluid collection.  No evidence of enteric fistula.
IMPRESSION: Minimal residual fluid collection in the right lower quadrant. No
evidence of enteric fistula. The drain was removed successfully.
